# Patient Record
Sex: Female | Born: 1975 | Hispanic: Yes | Marital: Married | Smoking: Never smoker
Health system: Southern US, Community
[De-identification: ages and names within clinical notes are randomized; demographics above are authoritative.]

## PROBLEM LIST (undated history)

## (undated) DIAGNOSIS — I10 Essential (primary) hypertension: Secondary | ICD-10-CM

## (undated) DIAGNOSIS — J45909 Unspecified asthma, uncomplicated: Secondary | ICD-10-CM

## (undated) DIAGNOSIS — E119 Type 2 diabetes mellitus without complications: Secondary | ICD-10-CM

## (undated) DIAGNOSIS — K219 Gastro-esophageal reflux disease without esophagitis: Secondary | ICD-10-CM

## (undated) DIAGNOSIS — F32A Depression, unspecified: Secondary | ICD-10-CM

## (undated) DIAGNOSIS — N63 Unspecified lump in unspecified breast: Secondary | ICD-10-CM

## (undated) DIAGNOSIS — R519 Headache, unspecified: Secondary | ICD-10-CM

## (undated) DIAGNOSIS — F329 Major depressive disorder, single episode, unspecified: Secondary | ICD-10-CM

## (undated) DIAGNOSIS — T8859XA Other complications of anesthesia, initial encounter: Secondary | ICD-10-CM

## (undated) HISTORY — PX: BREAST EXCISIONAL BIOPSY: SUR124

## (undated) HISTORY — DX: Unspecified lump in unspecified breast: N63.0

## (undated) HISTORY — DX: Major depressive disorder, single episode, unspecified: F32.9

## (undated) HISTORY — DX: Unspecified asthma, uncomplicated: J45.909

## (undated) HISTORY — DX: Depression, unspecified: F32.A

---

## 1997-04-07 HISTORY — PX: BREAST CYST EXCISION: SHX579

## 1998-04-07 HISTORY — PX: BREAST SURGERY: SHX581

## 2007-02-13 ENCOUNTER — Emergency Department: Payer: Self-pay | Admitting: Emergency Medicine

## 2007-02-13 IMAGING — US US OB < 14 WEEKS - US OB TV
1 series · 17 of 28 positions shown · non-contrast
Comparison: none

REASON FOR EXAM: Pelvic pain and bleeding
COMMENTS:

[Series 1: us ob < 14 weeks - us ob tv · 17 of 50 slices shown]
[im 1/50]
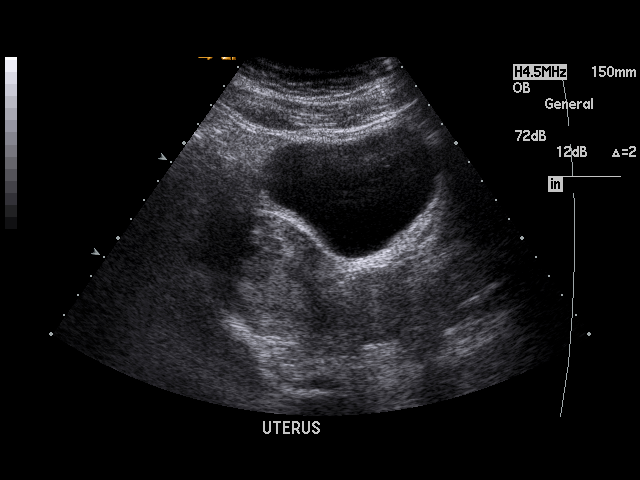
[im 4/50]
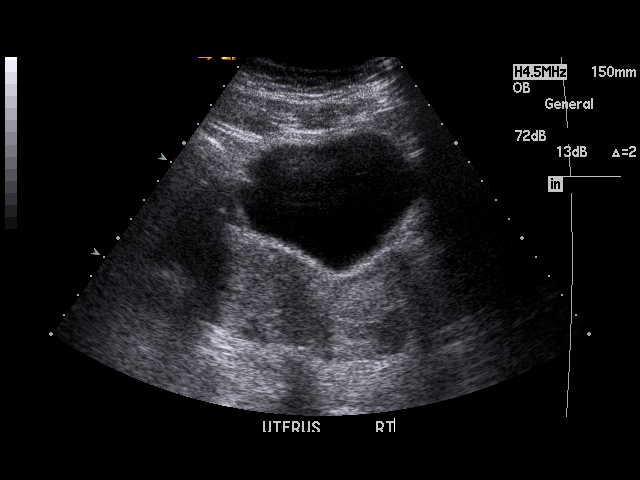
[im 8/50]
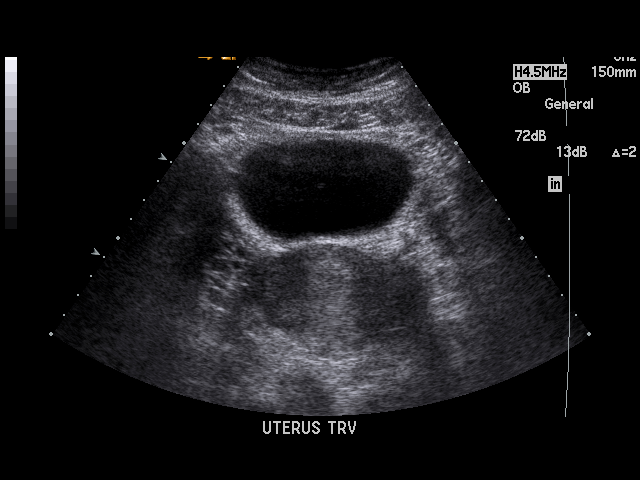
[im 10/50]
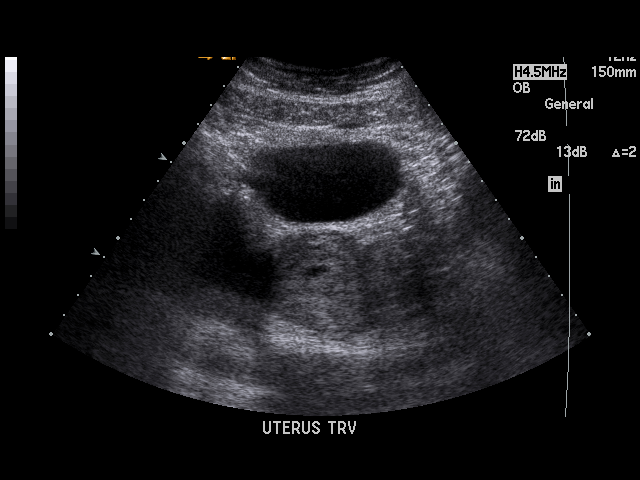
[im 13/50]
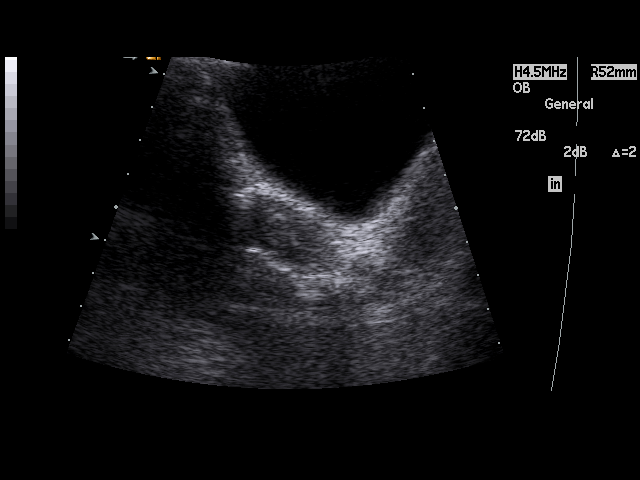
[im 17/50]
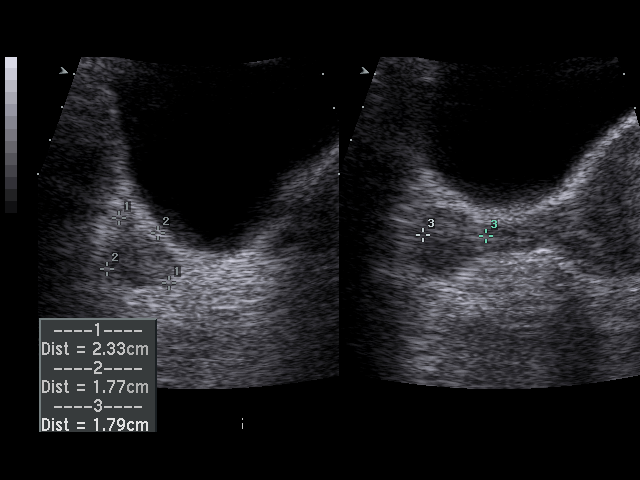
[im 19/50]
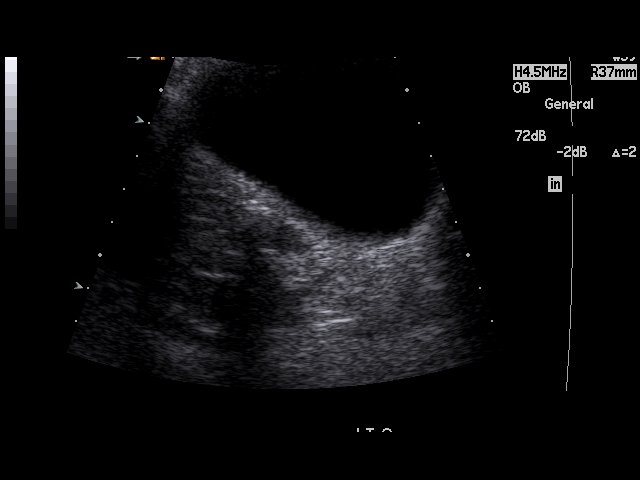
[im 22/50]
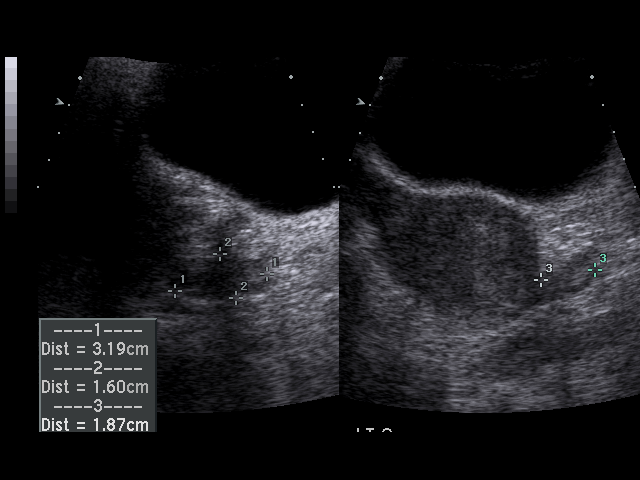
[im 26/50]
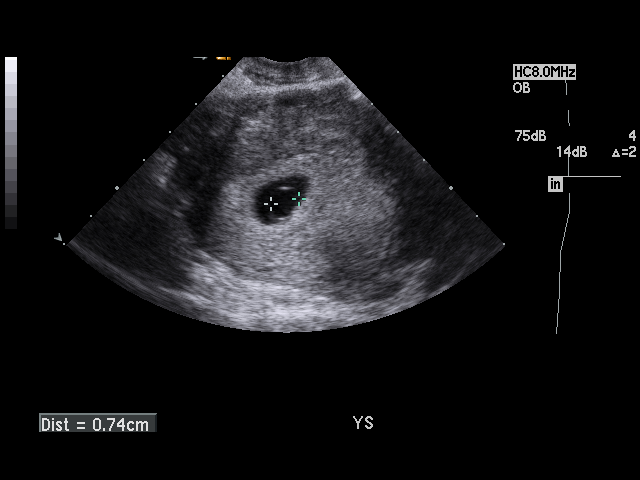
[im 28/50]
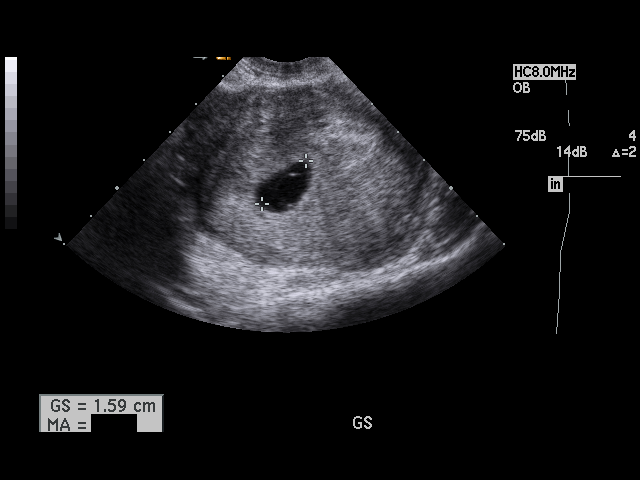
[im 31/50]
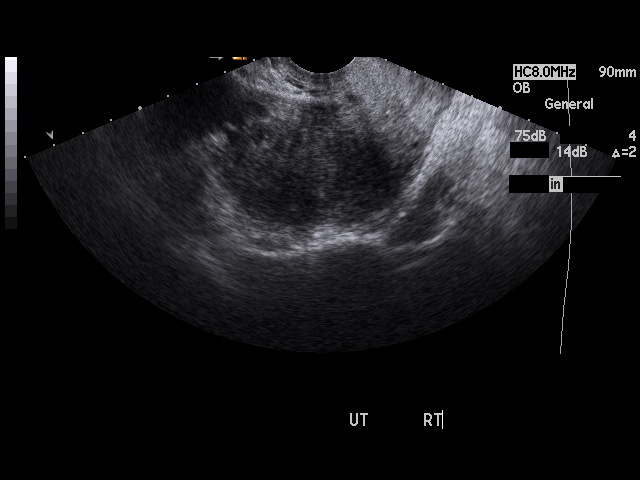
[im 33/50]
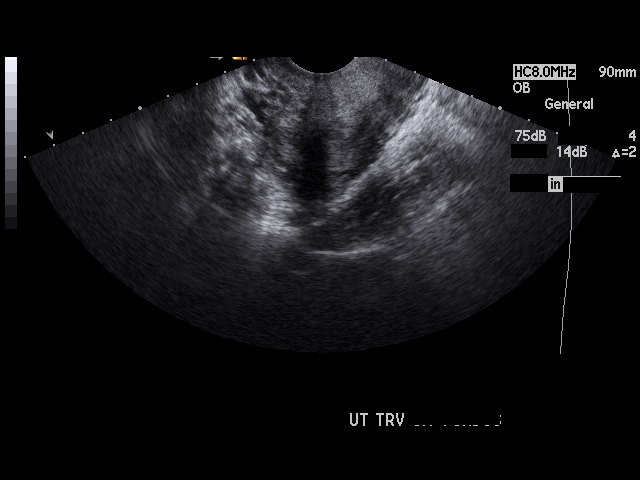
[im 37/50]
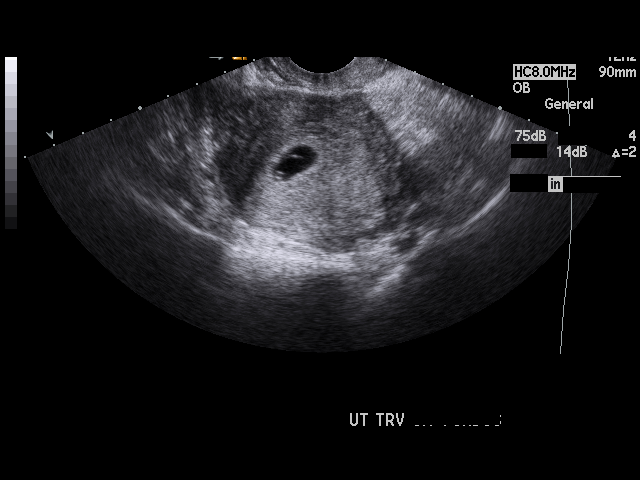
[im 40/50]
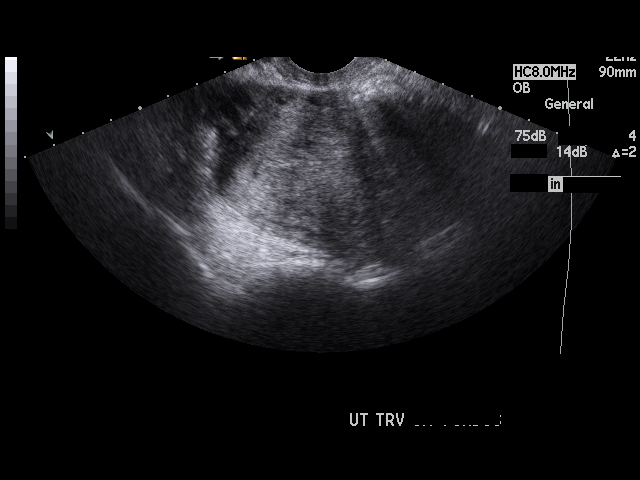
[im 42/50]
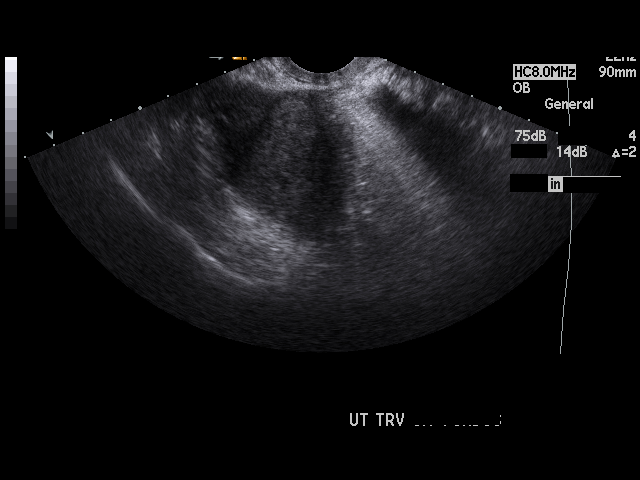
[im 46/50]
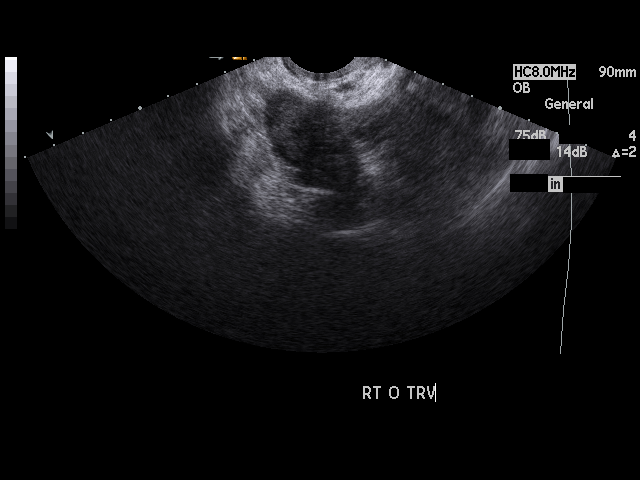
[im 50/50]
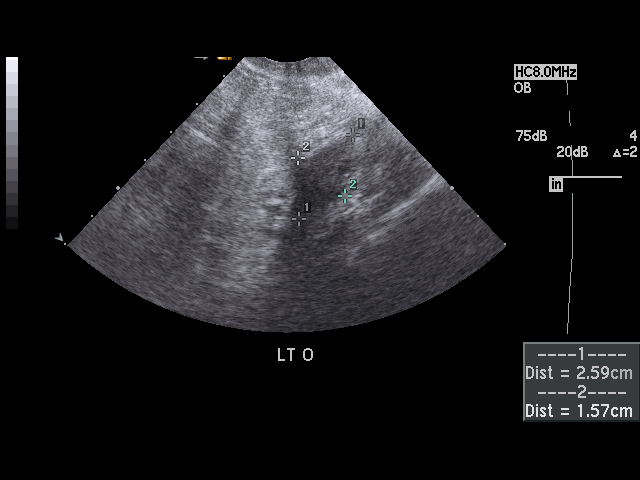

[17 of 28 positions shown; findings below may reference images not displayed]

PROCEDURE:     US  - US OB LESS THAN 14 WEEKS  - [DATE]  [DATE]

RESULT:  The patient is exhibiting vaginal bleeding and experiencing pelvic
discomfort.

There is a fluid filled sac in the endometrial cavity measuring 1.18 cm
which would correspond to a 5 week, 2 day gestation. No fetal pole is seen.
There is a structure that likely reflects a yolk sac. No fetal cardiac
activity is identified. The maternal RIGHT ovary measures 2.3 x 1.8 x
cm. The LEFT ovary measures 3.1 x 1.6 x 1.9 cm. There is no free fluid in
the cul-de-sac.
IMPRESSION: 1.  There is a fluid filled sac present which may contain a yolk sac but no
fetal pole is identified. This may reflect a blighted ovum or an incomplete
miscarriage. Follow-up scanning will be needed.
2.  The maternal adnexal structures are normal in appearance.

A preliminary report was sent to the [HOSPITAL] the conclusion
of the study.

## 2008-04-07 DIAGNOSIS — J45909 Unspecified asthma, uncomplicated: Secondary | ICD-10-CM

## 2008-04-07 DIAGNOSIS — N63 Unspecified lump in unspecified breast: Secondary | ICD-10-CM

## 2008-04-07 HISTORY — DX: Unspecified lump in unspecified breast: N63.0

## 2008-04-07 HISTORY — DX: Unspecified asthma, uncomplicated: J45.909

## 2008-04-14 ENCOUNTER — Ambulatory Visit: Payer: Self-pay | Admitting: Family Medicine

## 2008-04-14 IMAGING — CR DG LUMBAR SPINE AP/LAT/OBLIQUES W/ FLEX AND EXT
1 series · 5 of 5 positions shown · non-contrast
Comparison: none

REASON FOR EXAM: Back pain
COMMENTS:

[Series 1: view not recorded · 0.17mm/px · 5 of 5 slices shown]
[im 1/5]
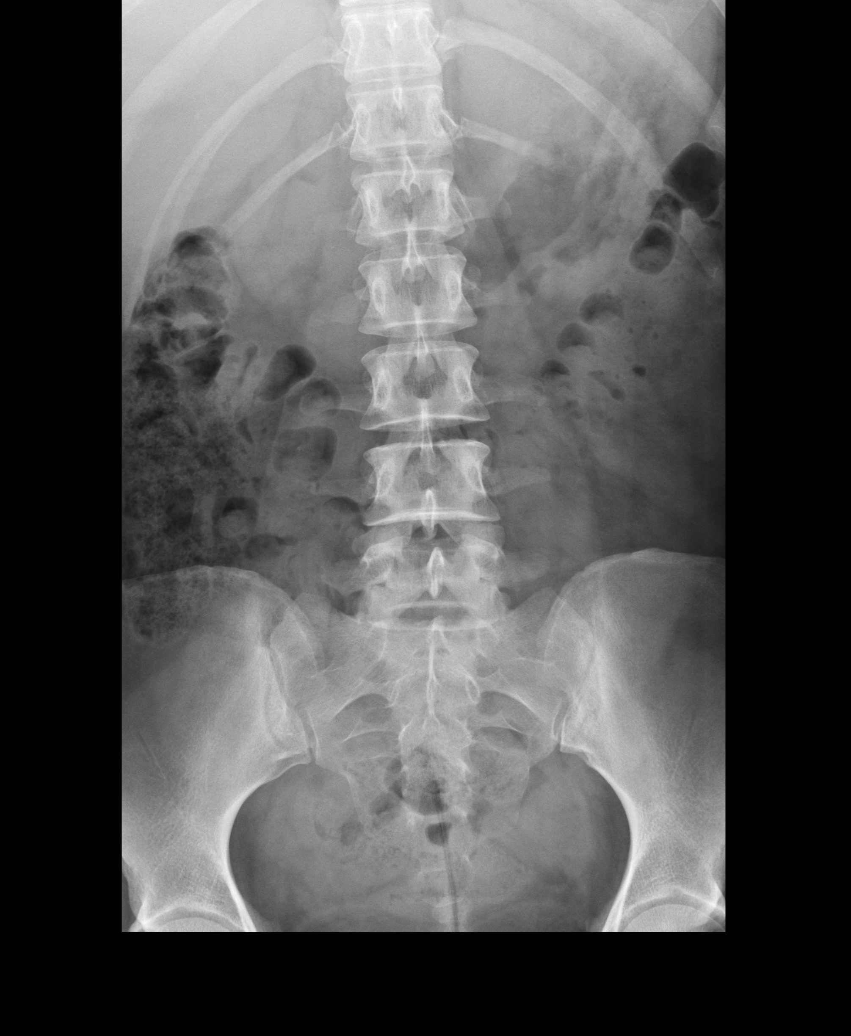
[im 2/5]
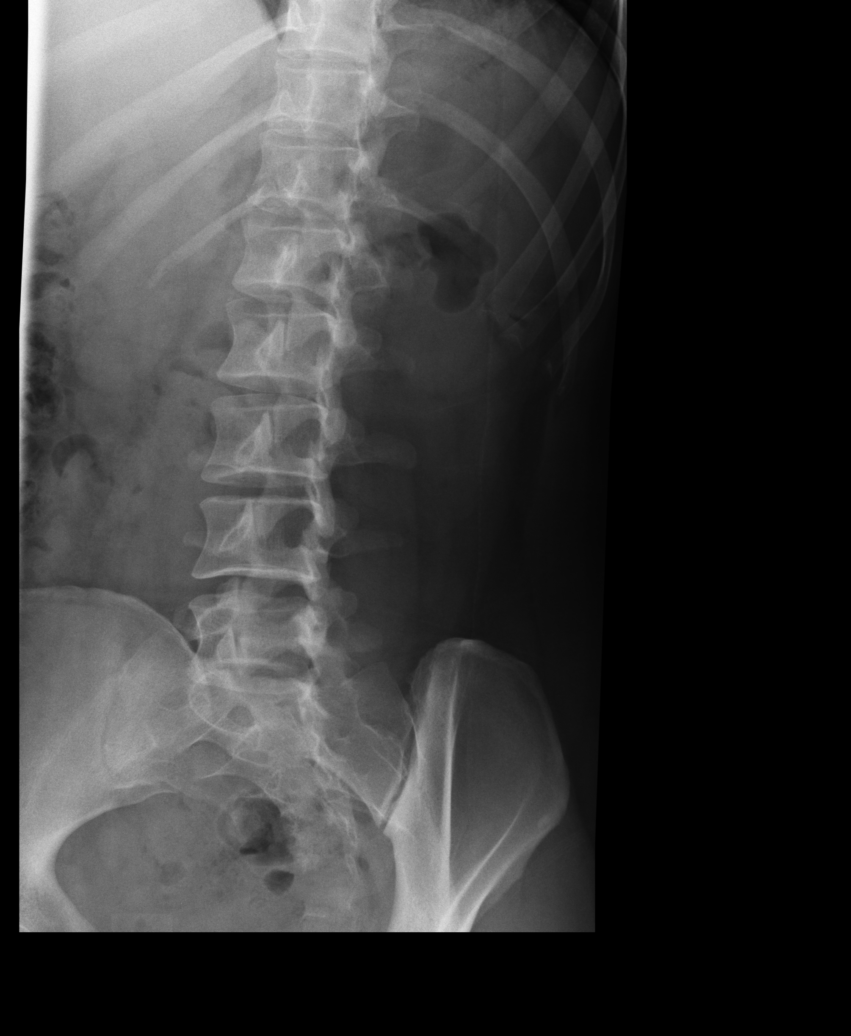
[im 3/5]
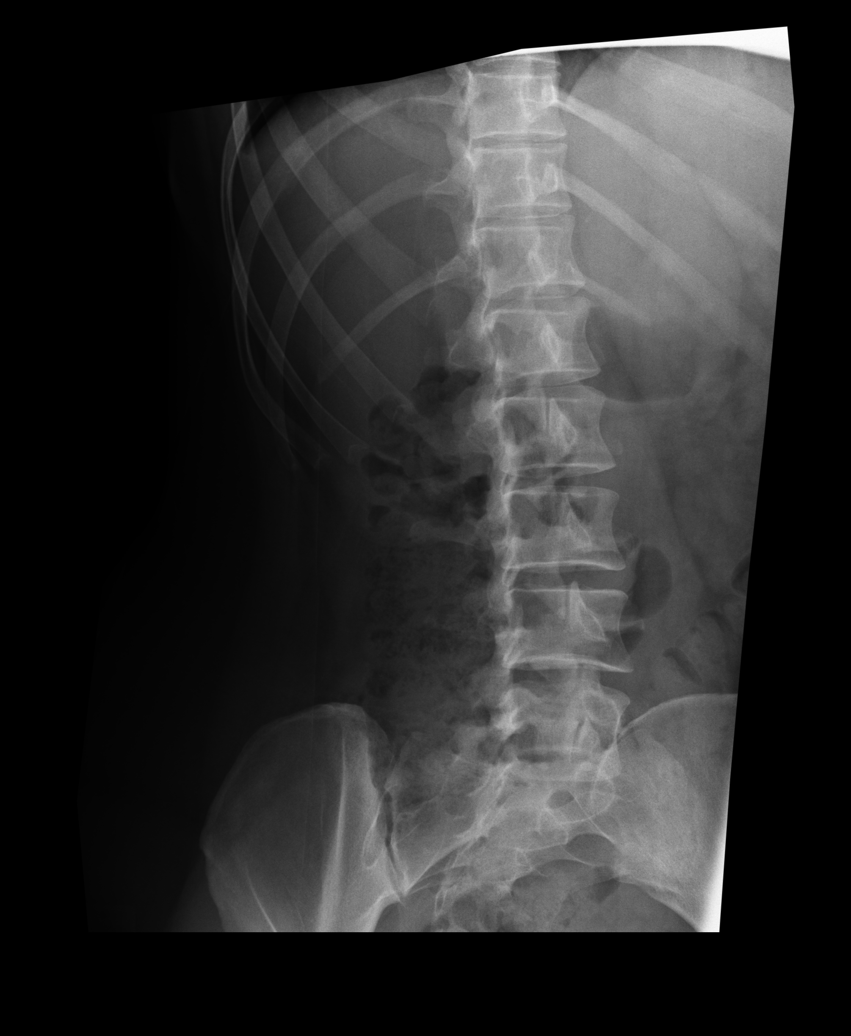
[im 4/5]
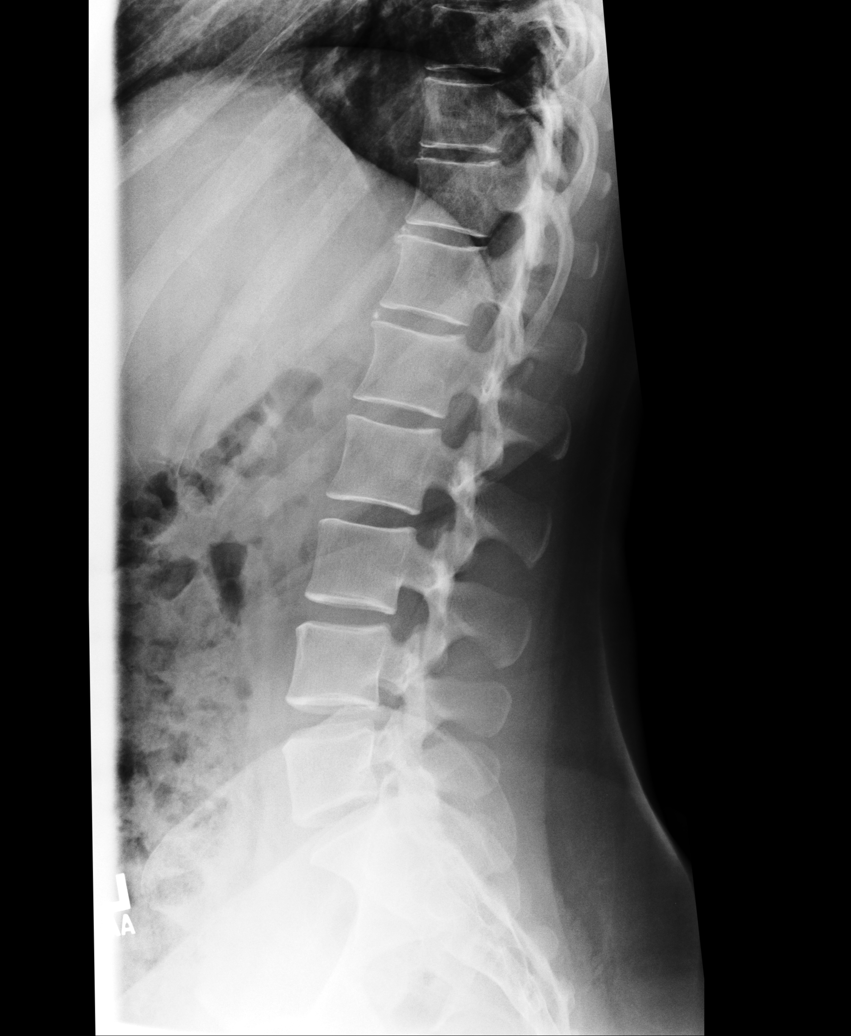
[im 5/5]
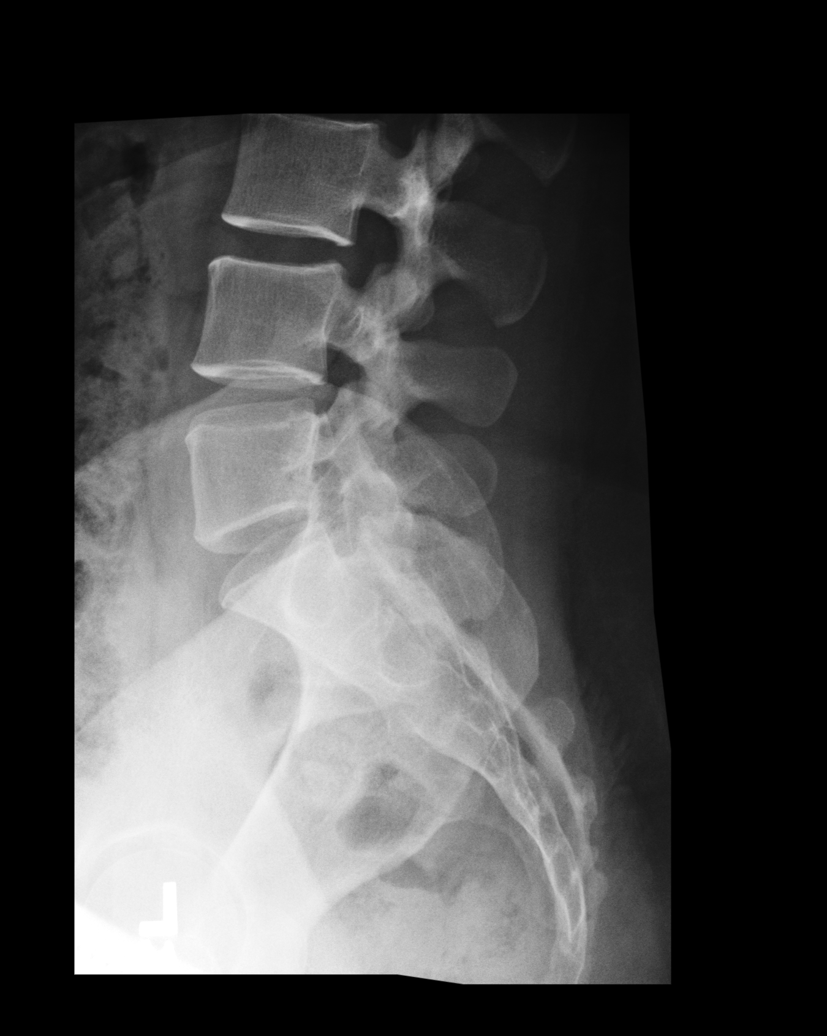

[5 of 5 positions shown; findings below may reference images not displayed]

PROCEDURE:     DXR - DXR LUMBAR SPINE WITH OBLIQUES  - [DATE]  [DATE]

RESULT:     The lumbar vertebral bodies are preserved in height. The
intervertebral disc space heights are reasonably well maintained. The
posterior elements appear intact. I see no pars defects or
spondylolisthesis. The pedicles and transverse processes are intact.
IMPRESSION: I do not see acute bony abnormality of the lumbar spine or
significant degenerative change. Given that there are chronic radicular
symptoms, further evaluation with MRI may be useful.

## 2008-04-20 ENCOUNTER — Ambulatory Visit: Payer: Self-pay | Admitting: Family Medicine

## 2008-04-20 IMAGING — US US PELV - US TRANSVAGINAL
1 series · 17 of 25 positions shown · non-contrast
Comparison: none

REASON FOR EXAM: pelvic pain hx ovarian cysts  PR notified for interpreter
COMMENTS:

[Series 1: us pelv - us transvaginal · 17 of 74 slices shown]
[im 1/74]
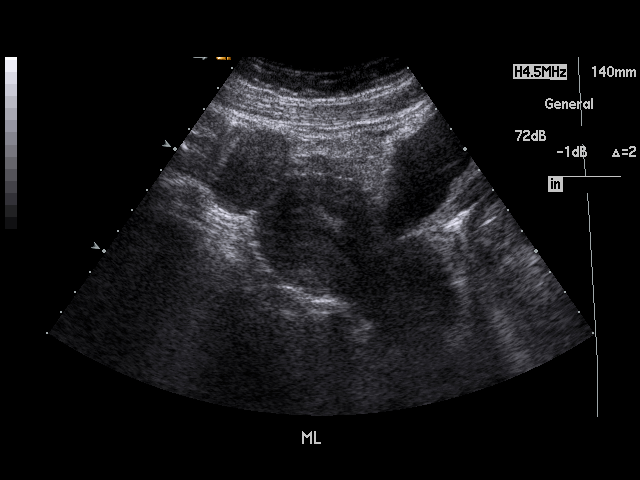
[im 7/74]
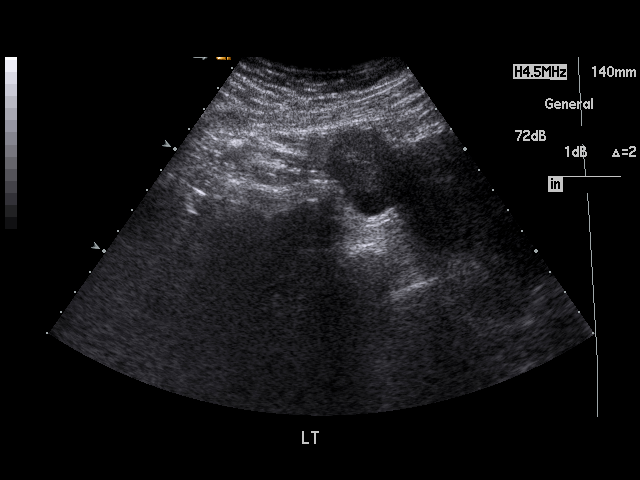
[im 10/74]
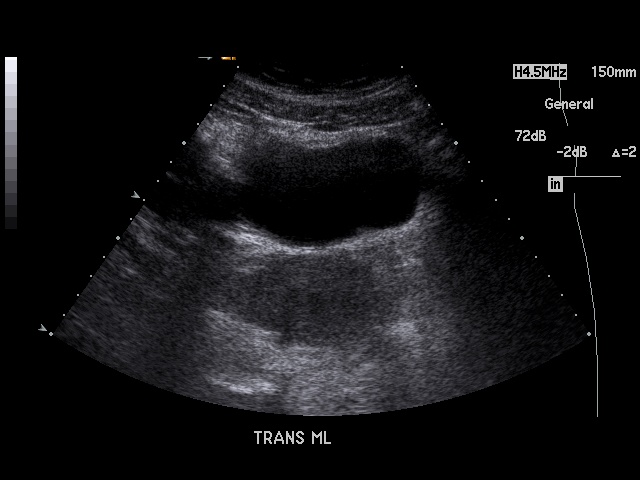
[im 16/74]
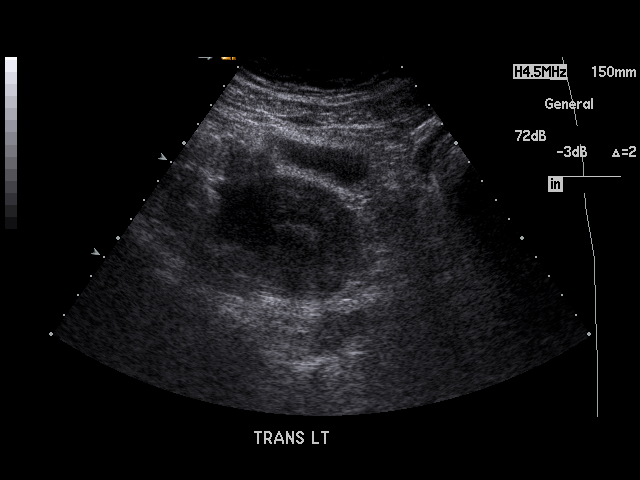
[im 19/74]
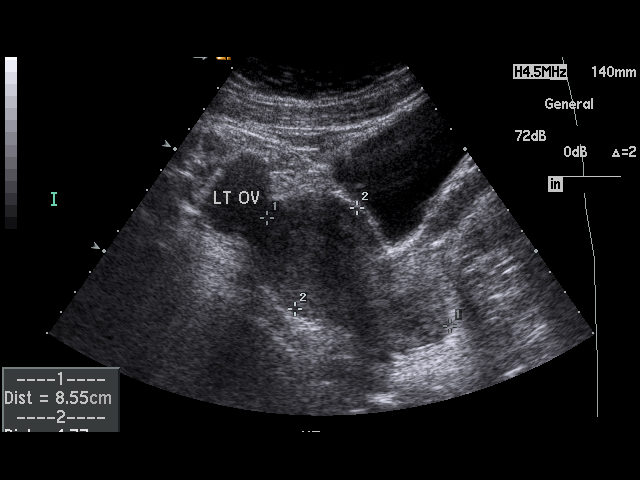
[im 25/74]
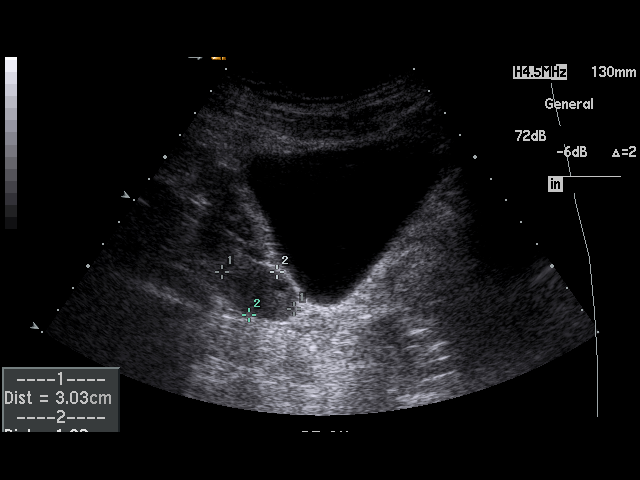
[im 28/74]
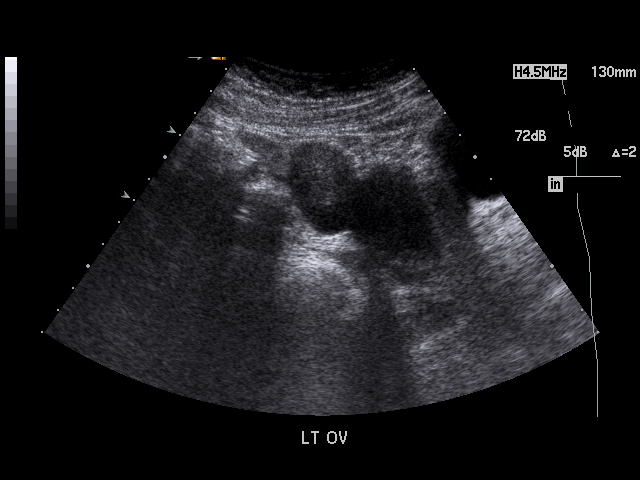
[im 34/74]
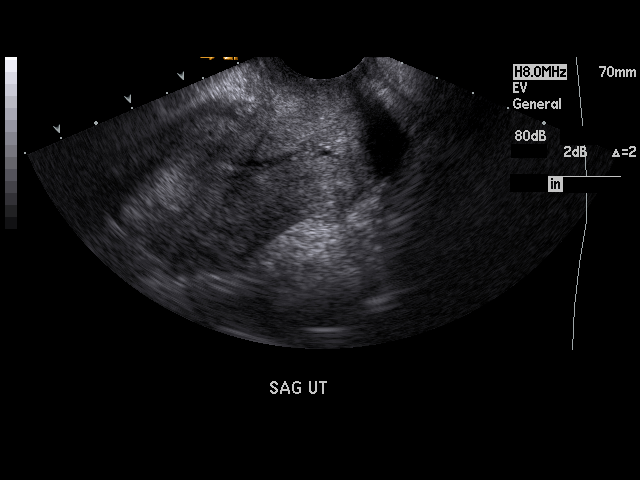
[im 37/74]
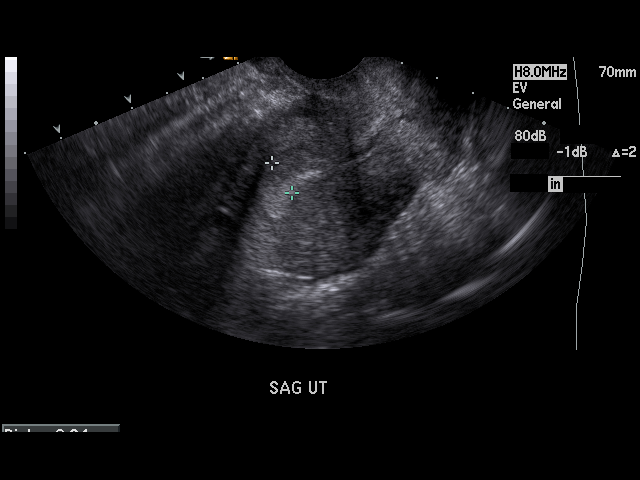
[im 40/74]
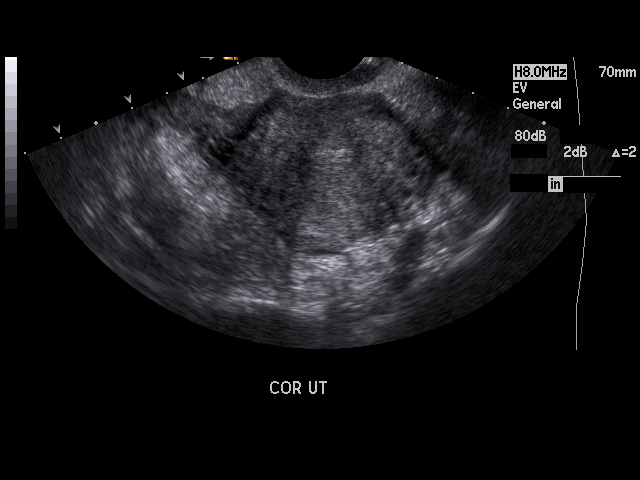
[im 46/74]
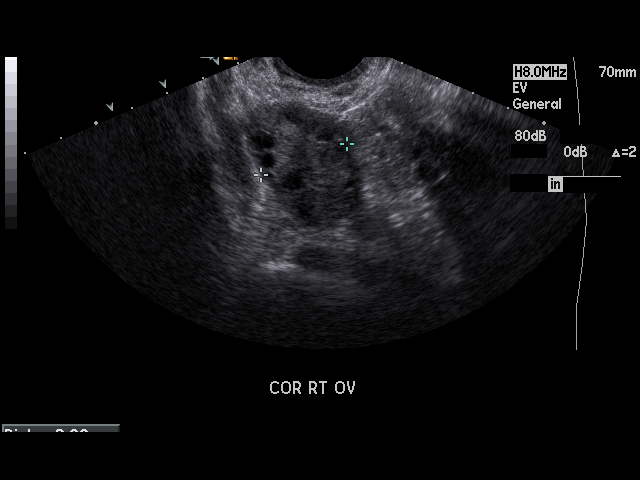
[im 49/74]
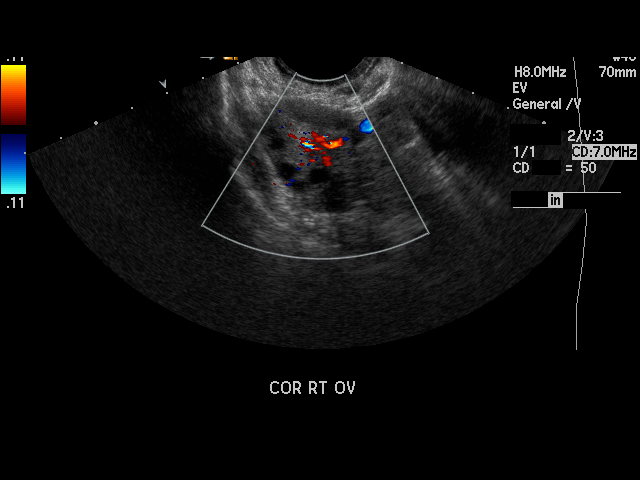
[im 55/74]
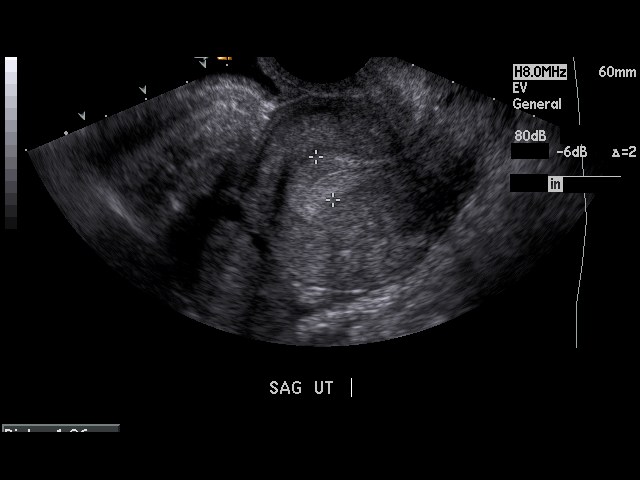
[im 58/74]
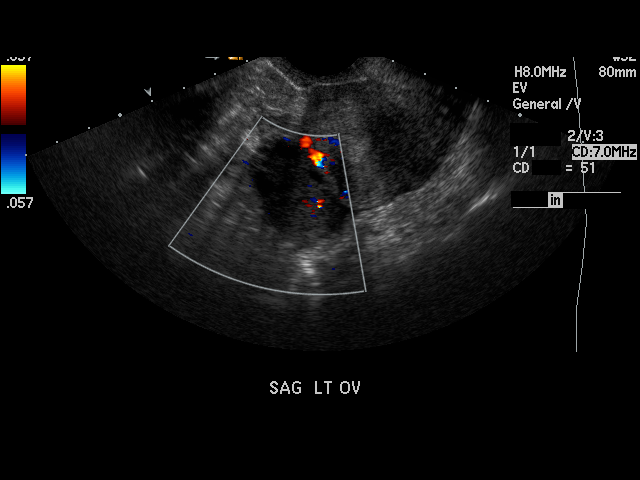
[im 64/74]
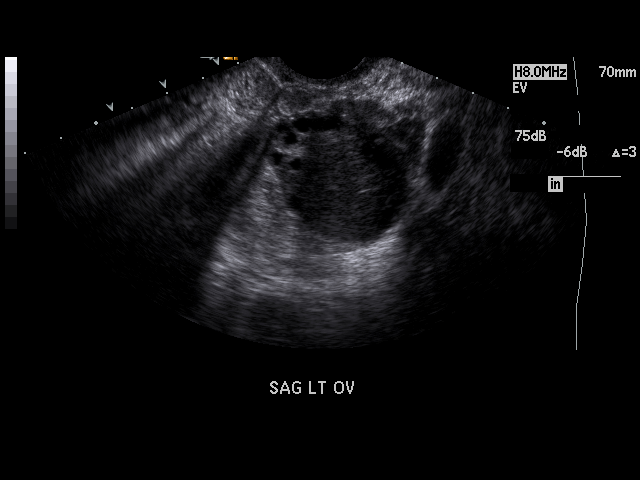
[im 67/74]
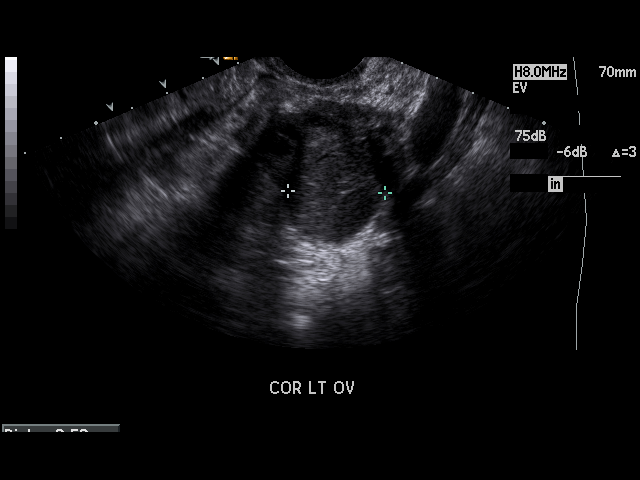
[im 74/74]
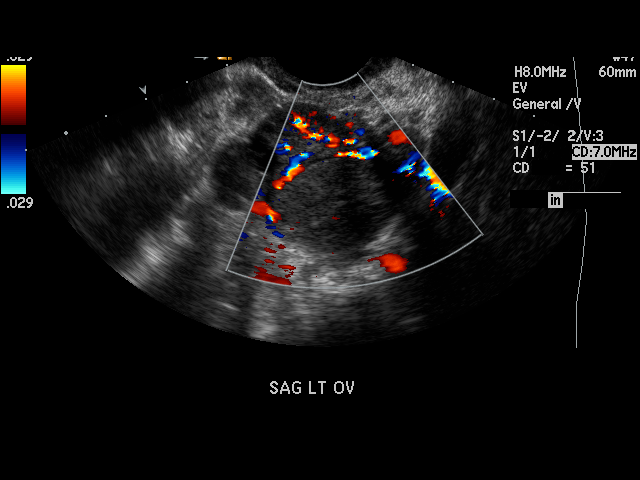

[17 of 25 positions shown; findings below may reference images not displayed]

PROCEDURE:     US  - US PELVIS MASS EXAM W/TRANSVAGI  - [DATE]  [DATE]

RESULT:     Transabdominal and endovaginal ultrasound was performed. The
uterus measures 8.55 cm x 5.88 cm x 4.77 cm.  No uterine mass lesions are
seen. The endometrium measures 10.6 mm in thickness. The RIGHT and LEFT
ovaries are visualized. The RIGHT ovary measures 3.56 cm at maximum diameter
and the LEFT ovary measures 3.76 cm at maximum diameter. A few follicular
cysts are noted in each ovary. No abnormal adnexal masses are seen. There is
no free fluid in the pelvis. The kidneys are visualized bilaterally and show
no hydronephrosis. The visualized portion of the urinary bladder is normal
in appearance.
IMPRESSION: 1.     No significant abnormalities are identified.

## 2009-01-04 ENCOUNTER — Ambulatory Visit: Payer: Self-pay | Admitting: Certified Nurse Midwife

## 2009-01-04 IMAGING — US US OB US >=[ID] SNGL FETUS
1 series · 17 of 28 positions shown · non-contrast
Comparison: none

REASON FOR EXAM: COMMENTS:

[Series 1: us ob us >=(id) sngl fetus · 17 of 61 slices shown]
[im 1/61]
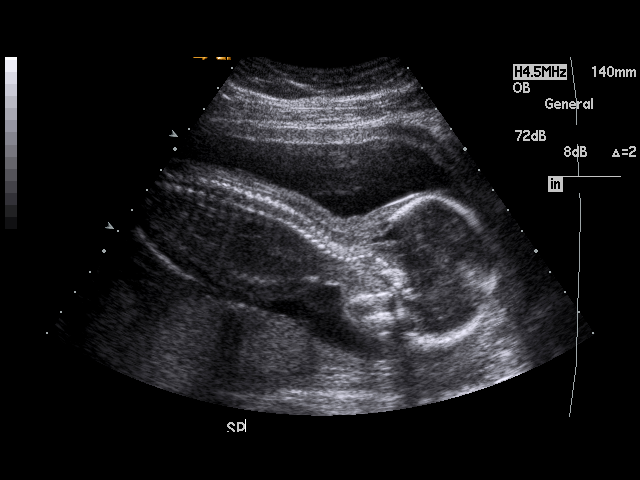
[im 5/61]
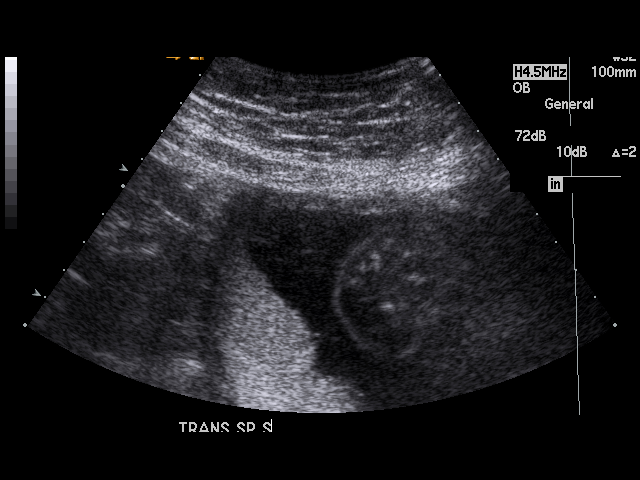
[im 9/61]
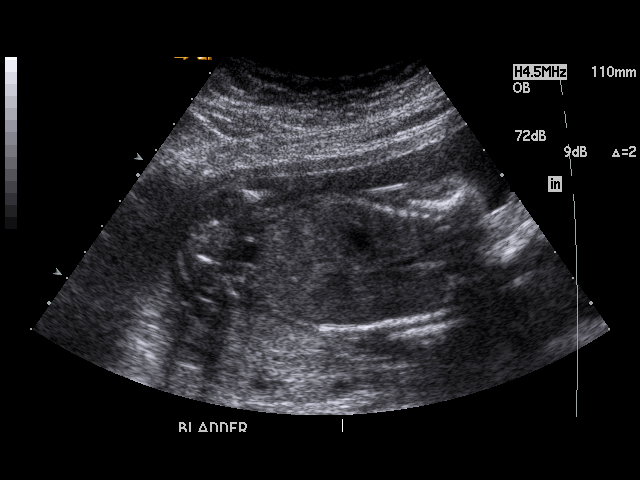
[im 12/61]
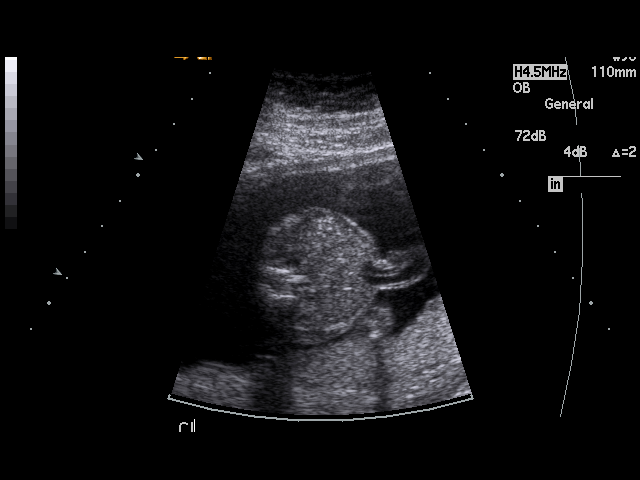
[im 16/61]
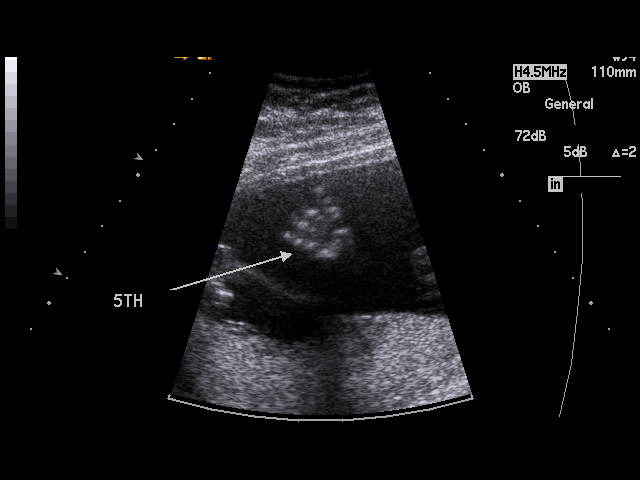
[im 21/61]
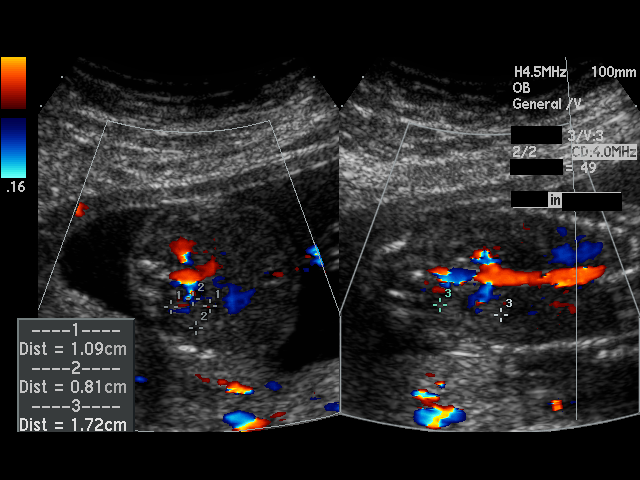
[im 23/61]
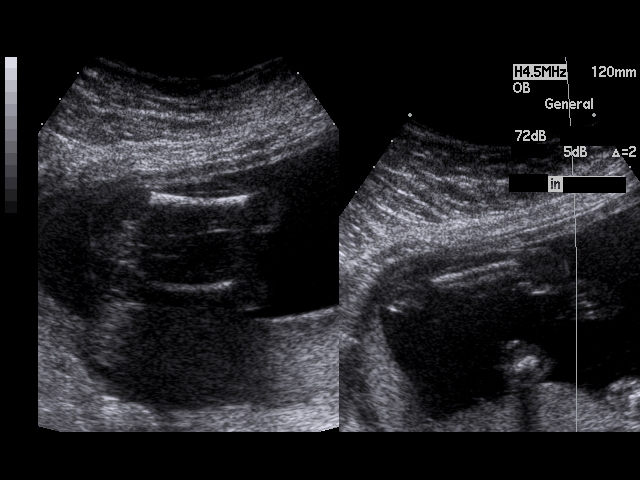
[im 27/61]
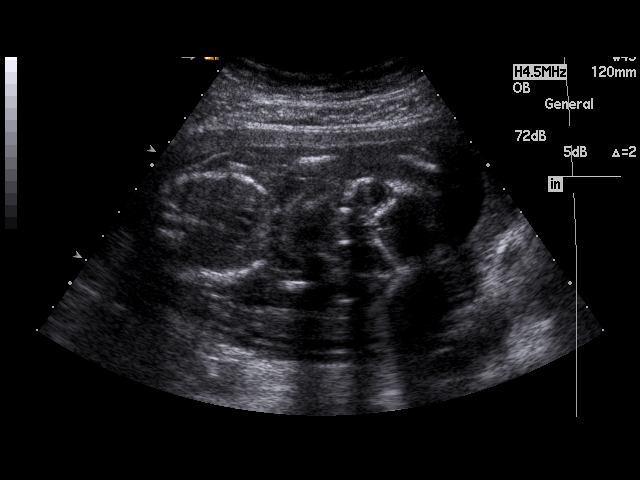
[im 32/61]
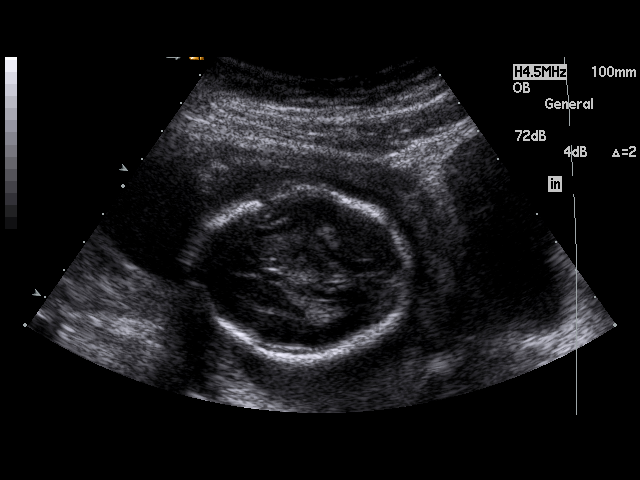
[im 34/61]
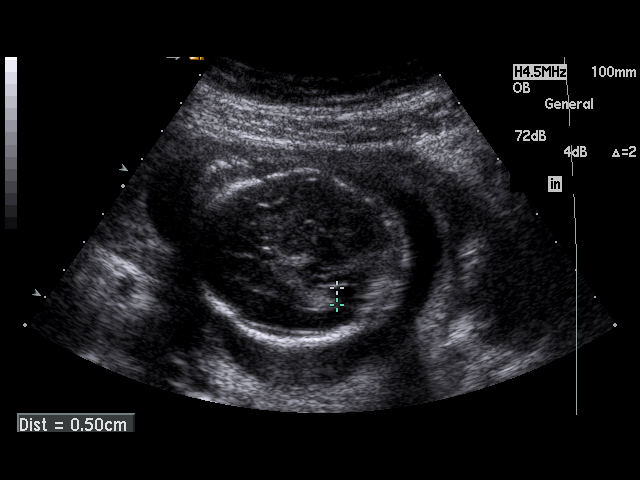
[im 38/61]
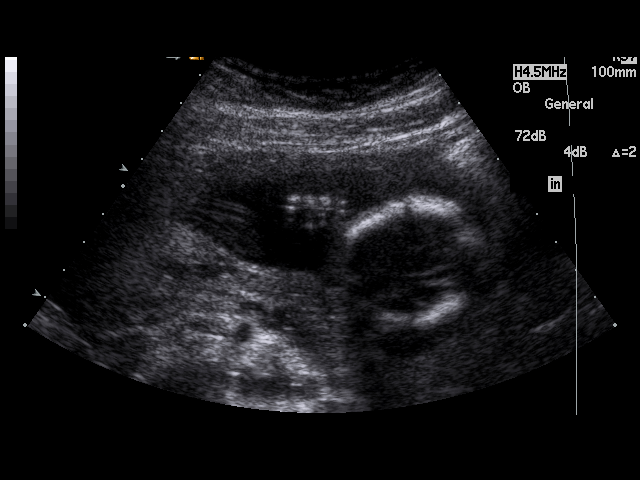
[im 41/61]
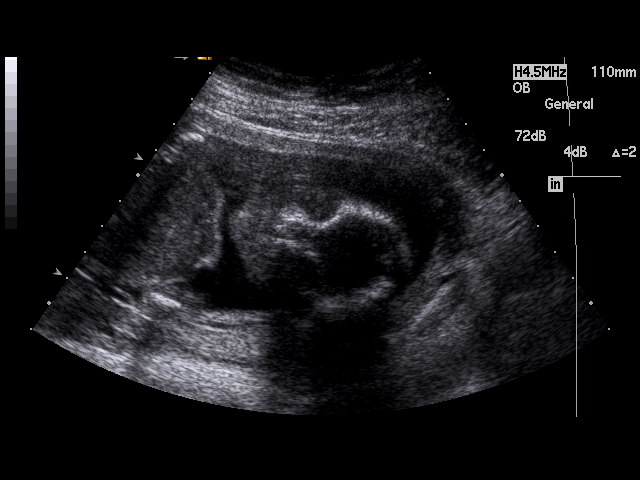
[im 45/61]
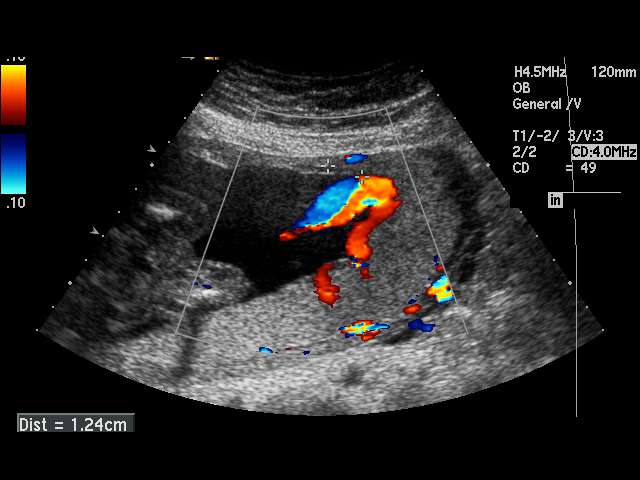
[im 49/61]
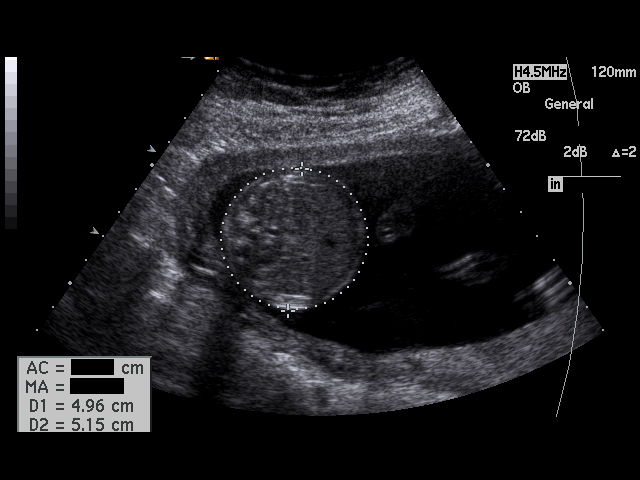
[im 52/61]
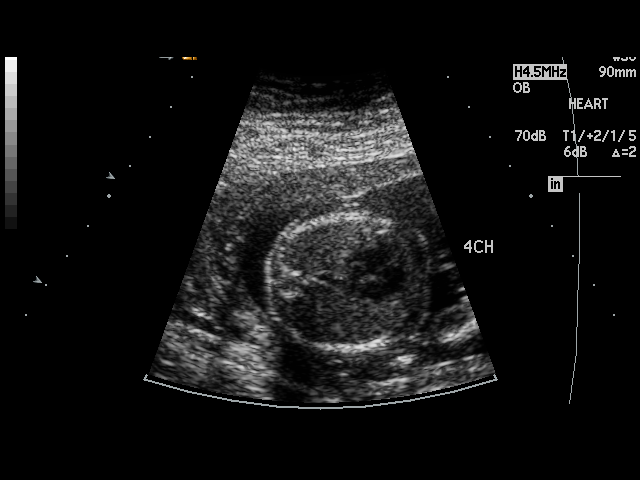
[im 56/61]
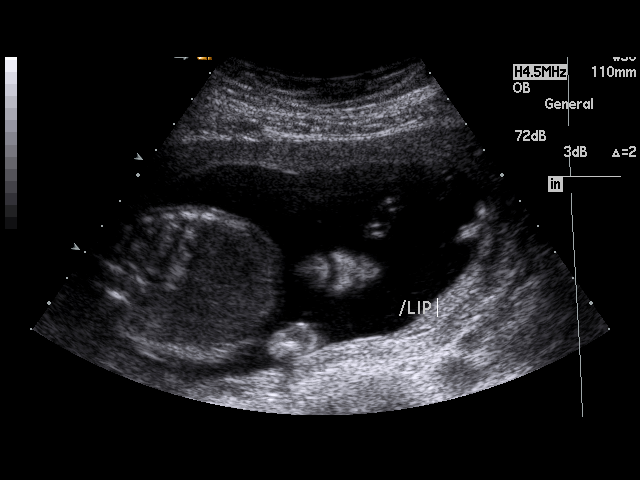
[im 61/61]
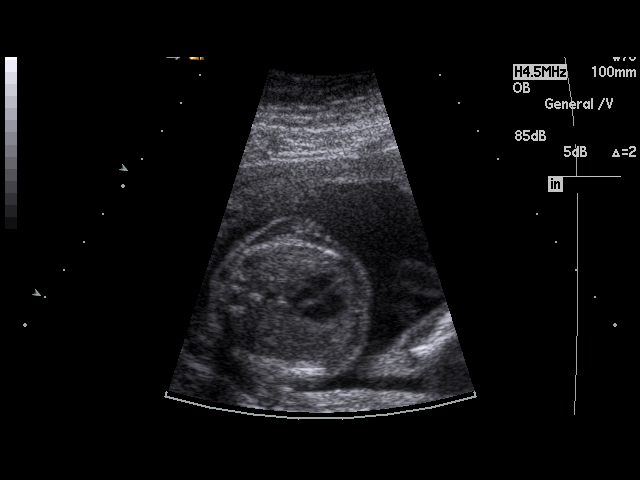

[17 of 28 positions shown; findings below may reference images not displayed]

PROCEDURE:     US  - US OB GREATER/OR EQUAL TO [1F]  - [DATE]  [DATE]

RESULT:      A single, viable intrauterine pregnancy is appreciated with
estimated fetal heart rate of 150 beats per minute. Visual evaluation of the
amniotic fluid is unremarkable. Fetal anatomy demonstrates no bladder,
renal, stomach, cardiac, diaphragm, spine or ventricular abnormalities. The
placenta is Grade 0 and is in a posterior left lateral fundal location.

Fetal biometry:

BPD is 4.96 cm; EGA is 21 weeks, 0 days

HC is 17.86 cm; EGA is 20 weeks, 2 days

AC is 15.55 cm; EGA is 20 weeks, 5 days

FL is 3.17 cm; EGA is 19 weeks, 6 days

HL is 3.02 cm; EGA is 20 weeks, 0 days

Estimated fetal weight is 375 grams + / - 50 grams which corresponds to 13
ounces + / - 2 ounces.

EGA per LMP is 18 weeks, 2 days with an EDD of [DATE]. EGA per ultrasound
is 20 weeks, 2 days and EDD of [DATE].
IMPRESSION: Single viable intrauterine as described above.

Addendum:  The patient's EDD per ultrasound is  [DATE] not [DATE].

## 2009-05-22 ENCOUNTER — Inpatient Hospital Stay: Payer: Self-pay

## 2010-07-31 ENCOUNTER — Ambulatory Visit: Payer: Self-pay | Admitting: Internal Medicine

## 2010-07-31 IMAGING — US ULTRASOUND LEFT BREAST
1 series · 13 of 13 positions shown · non-contrast
Comparison: none

REASON FOR EXAM: L  cystic tissue  2 to 4 oclock COPY TO HITESH
INTERPRETER NEEDED
COMMENTS:

PROCEDURE:     US  - US BREAST LEFT  - [DATE]  [DATE]
RESULT:

[Series 1: ultrasound left breast · 13 of 13 slices shown]
[im 1/13]
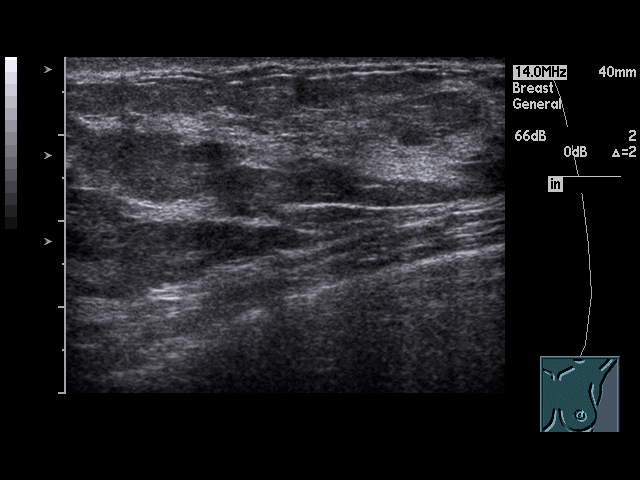
[im 2/13]
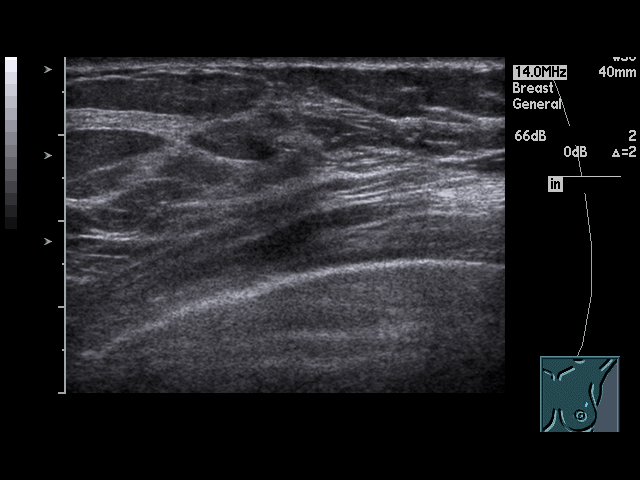
[im 3/13]
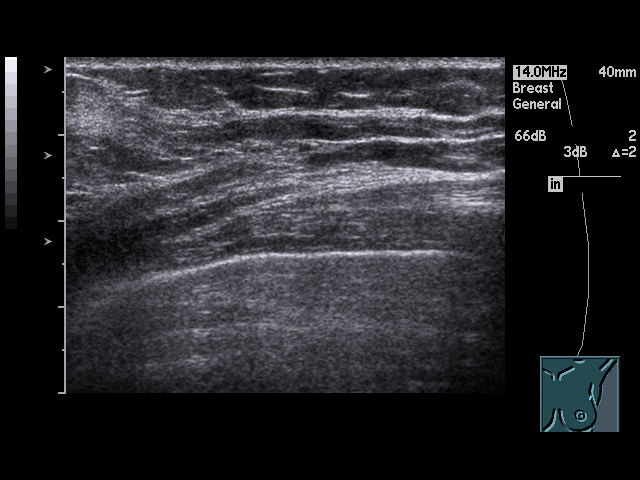
[im 4/13]
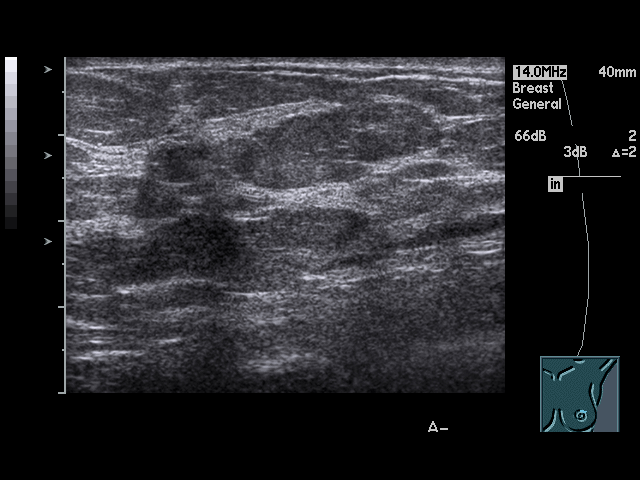
[im 5/13]
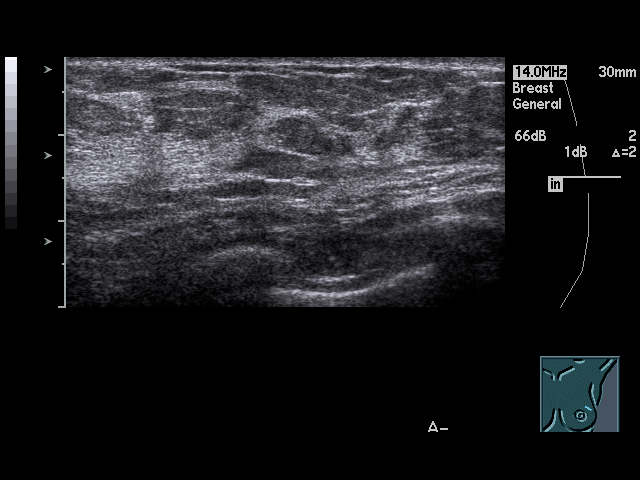
[im 6/13]
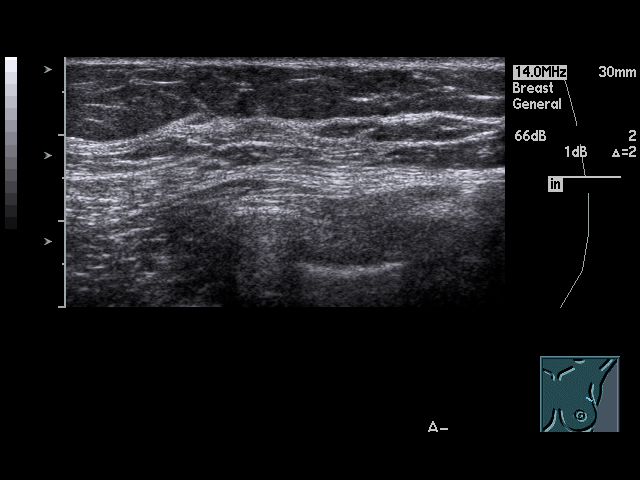
[im 7/13]
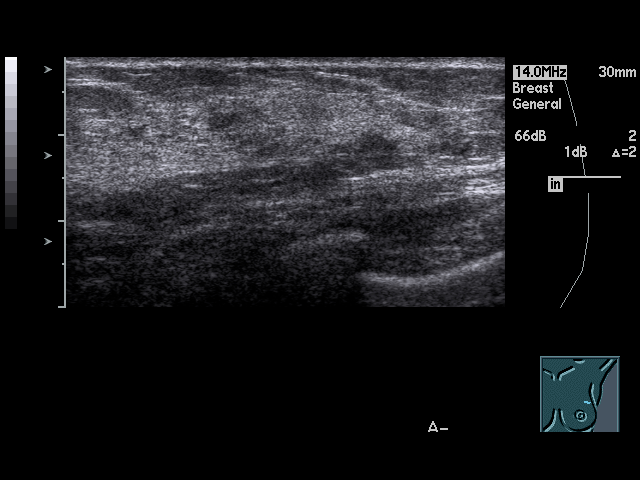
[im 8/13]
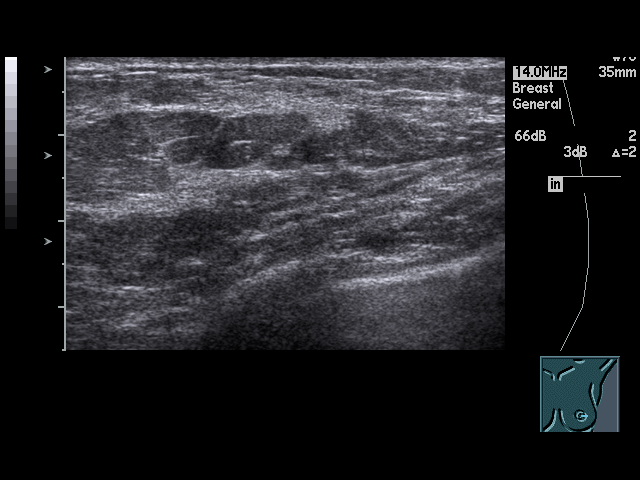
[im 9/13]
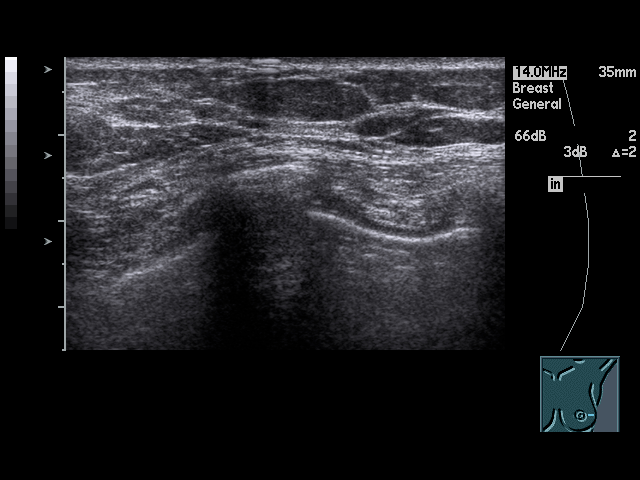
[im 10/13]
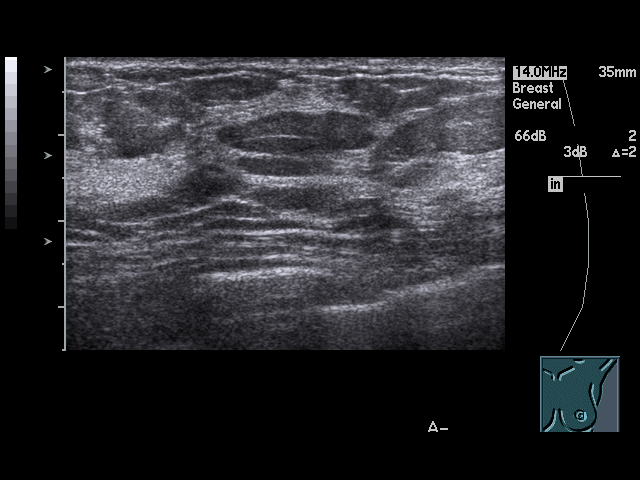
[im 11/13]
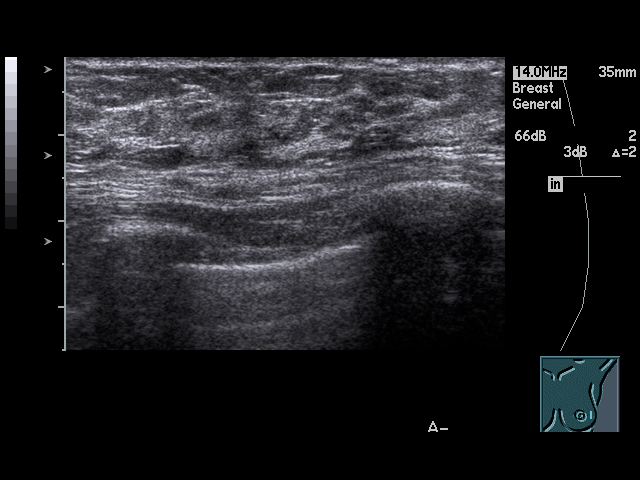
[im 12/13]
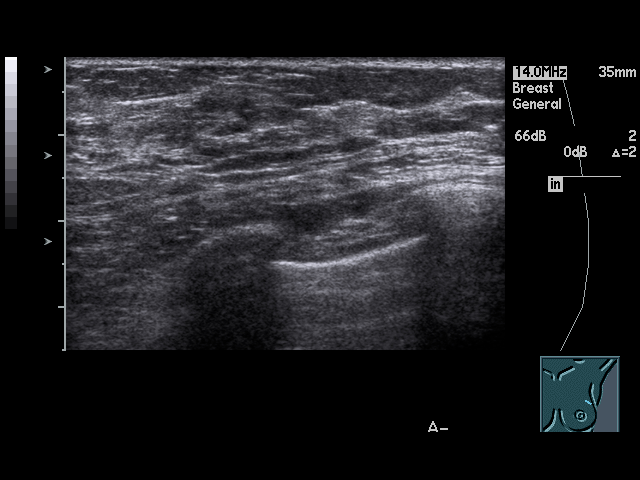
[im 13/13]
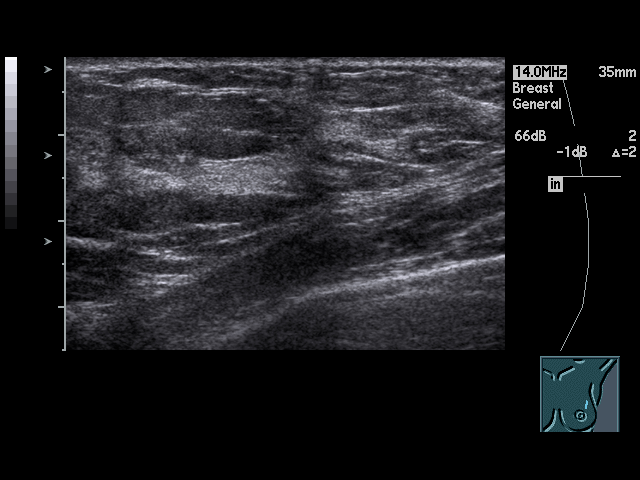

[13 of 13 positions shown; findings below may reference images not displayed]

FINDINGS: The left breast was evaluated in the region of interest from the 2
o'clock to the 3 o'clock position. No solid or cystic sonographic
abnormalities are appreciated.
IMPRESSION: Unremarkable focused left breast ultrasound as described
above. Please refer to the bilateral digital diagnostic mammogram for
completed discussion.

## 2010-07-31 IMAGING — MG MAM [PERSON_NAME] DIAGNOSTIC W/CAD
1 series · 8 of 8 positions shown · non-contrast
Comparison: none

REASON FOR EXAM: L  cystic tissue  2 to 4 oclock COPY TO TSEGESH
INTERPRETER NEEDED
COMMENTS:

[R CC · right · 8 of 9 slices shown]
[im 1/9]
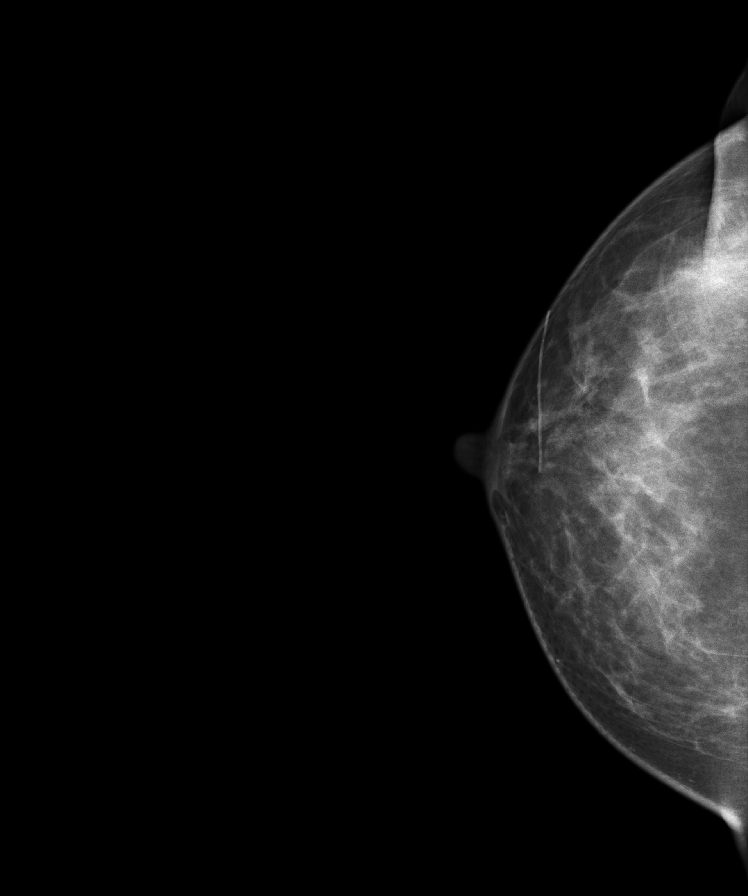
[im 2/9]
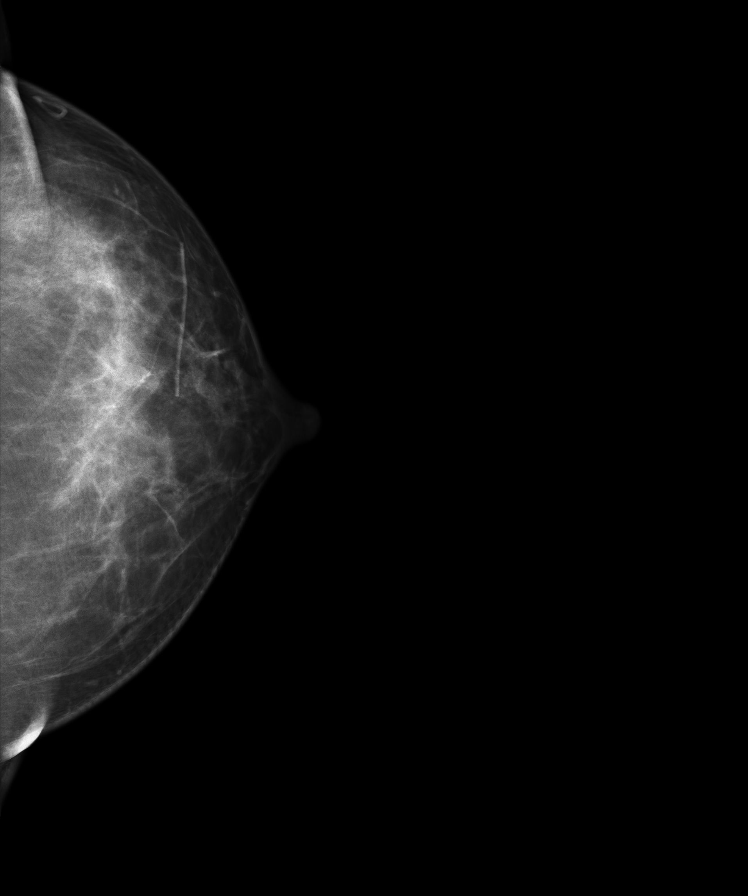
[im 3/9]
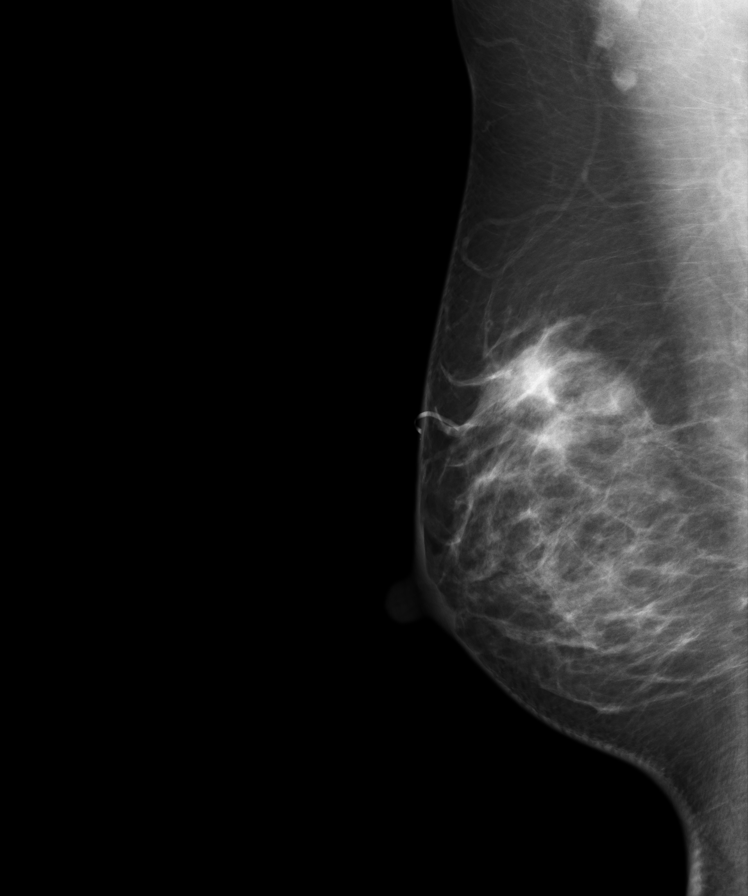
[im 4/9]
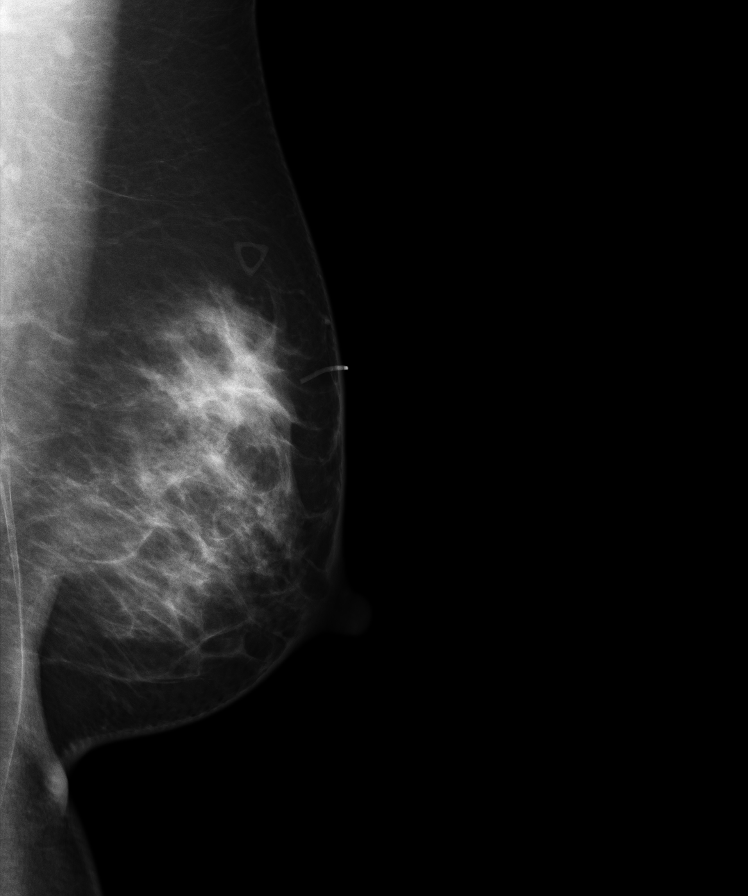
[im 5/9]
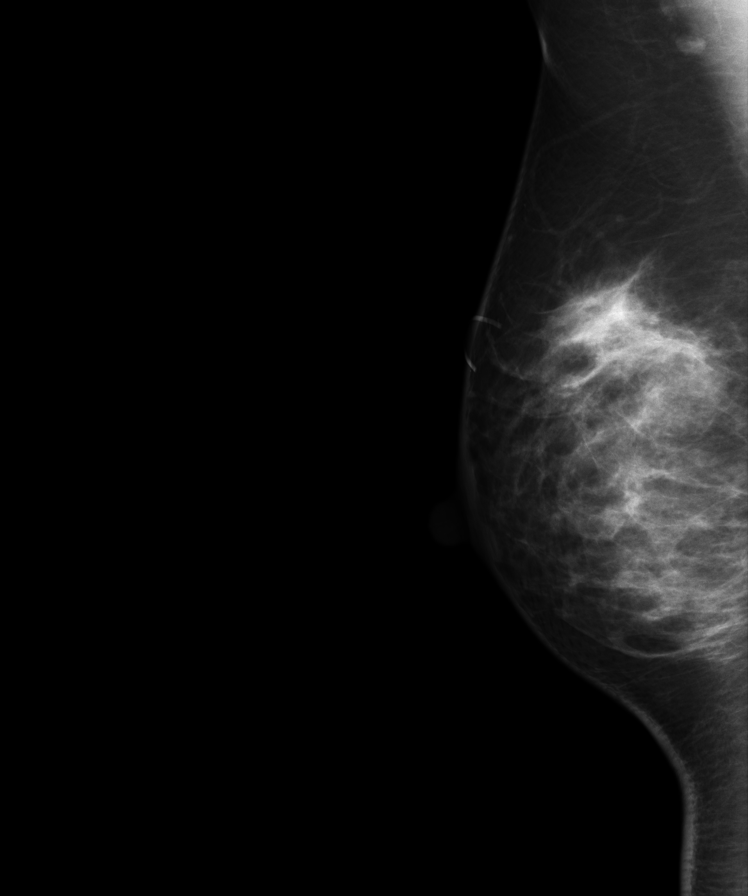
[im 6/9]
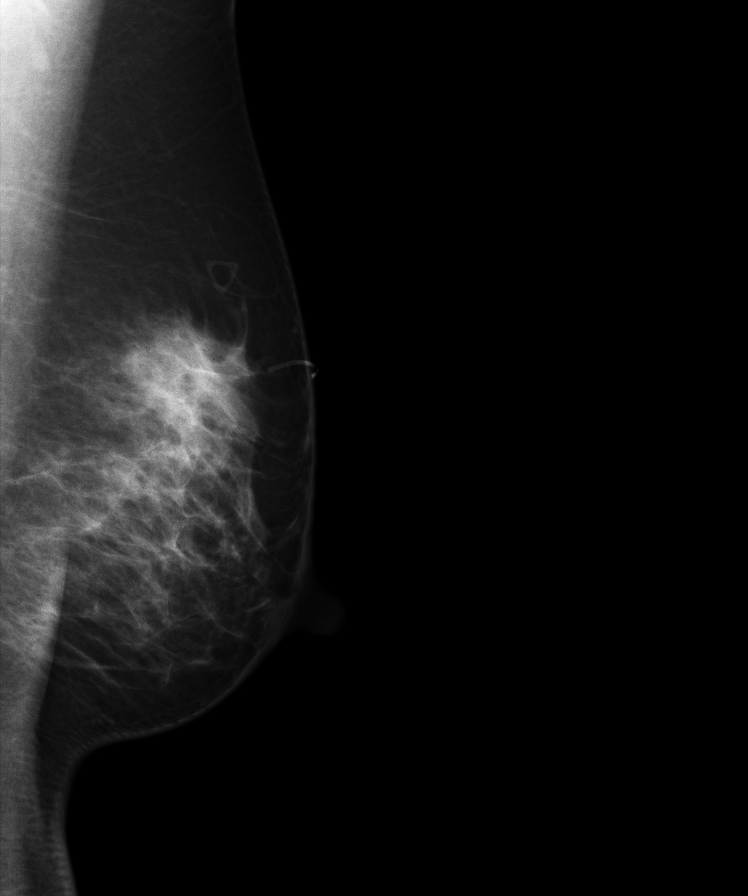
[im 7/9]
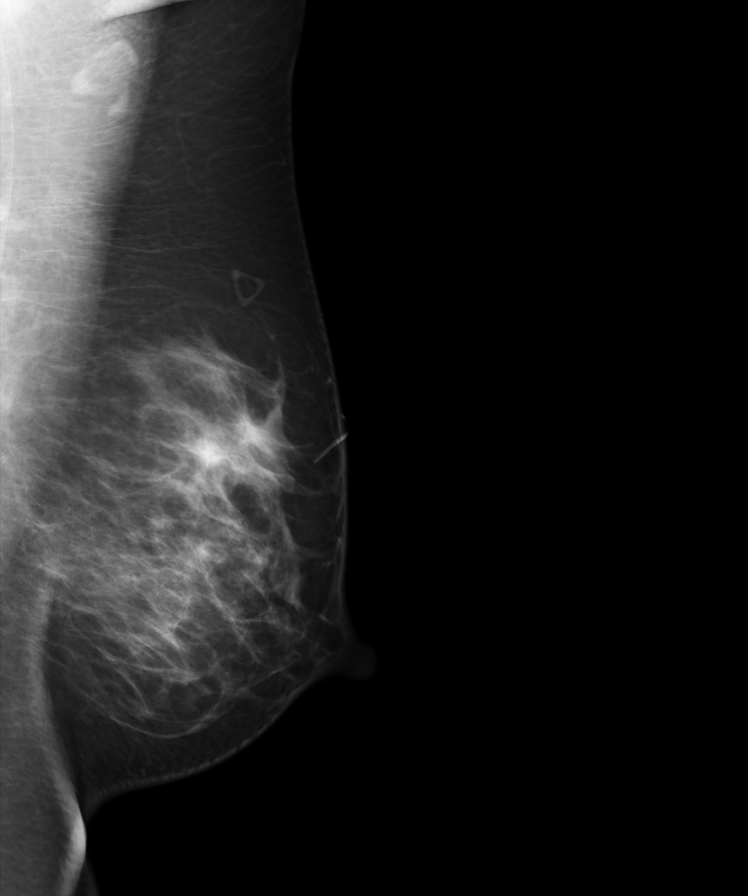
[im 9/9]
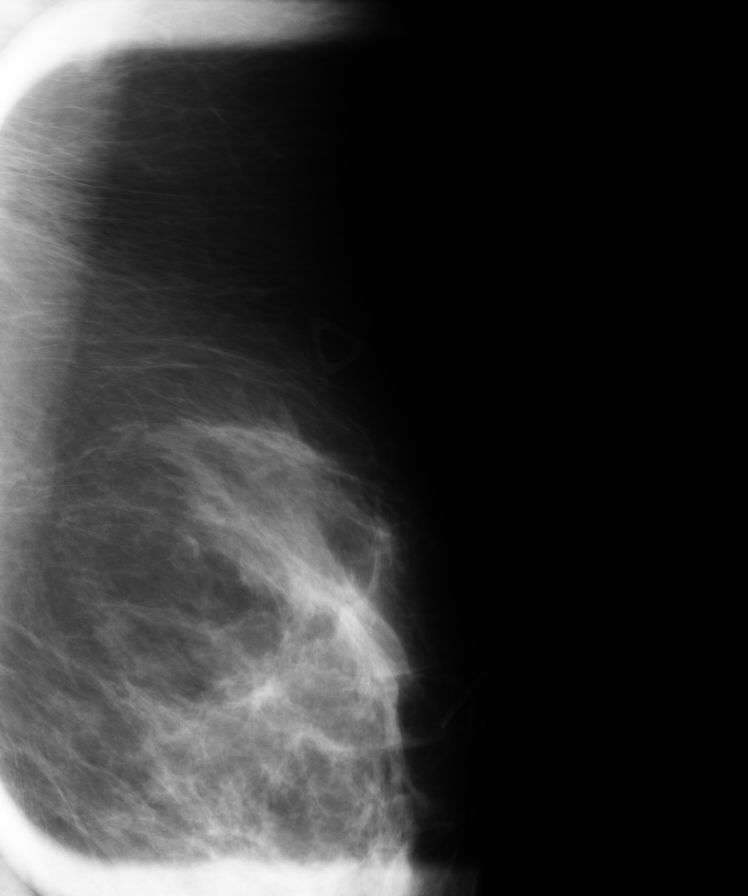

[8 of 8 positions shown; findings below may reference images not displayed]

PROCEDURE:     MAM - MAM TSEGESH DIAGNOSTIC W/CAD  - [DATE]  [DATE]

RESULT:      The breasts demonstrate a heterogeneous parenchymal pattern.
The patient also has a history of bilateral lumpectomies. In the region of
palpable concern, there is heterogeneity within the breast tissue which
effaces with compression. Sonographic evaluation of the region of palpable
concern demonstrates no sonographic abnormalities. These findings are within
the left breast. Within the right breast, an area of increased density with
a nodular appearance is appreciated within the superior portion of the
breast on the MLO view. ML evaluation of right breast demonstrates no
evidence of an ML  correlate. Considering these findings as well as the
patient's history of prior lumpectomy, note this area does project in the
marked area of prior lumpectomy. This finding is likely a benign finding. A
6-month reevaluation of the superior portion of the right breast on MLO view
is recommended to assure stability and/or possible resolution. Mammographic
evaluation of the region of interest within the left breast demonstrates no
sonographic abnormalities.
IMPRESSION: 1.     Likely benign finding within the right breast.
2.     BI-RADS: Category 3-Probably Benign Finding (interval follow up).
3.     6-month reevaluation recommended.
4.     The area of palpable concern within the left breast demonstrates no
sonographic or radiographic abnormalities.

## 2010-10-23 ENCOUNTER — Emergency Department: Payer: Self-pay | Admitting: *Deleted

## 2011-02-11 ENCOUNTER — Ambulatory Visit: Payer: Self-pay | Admitting: Internal Medicine

## 2011-02-19 ENCOUNTER — Ambulatory Visit: Payer: Self-pay | Admitting: Internal Medicine

## 2011-02-19 IMAGING — US ULTRASOUND RIGHT BREAST
1 series · 17 of 18 positions shown · non-contrast
Comparison: none

REASON FOR EXAM: ADLAHO US RT DENSITY
COMMENTS:

[Series 1: ultrasound right breast · 17 of 18 slices shown]
[im 1/18]
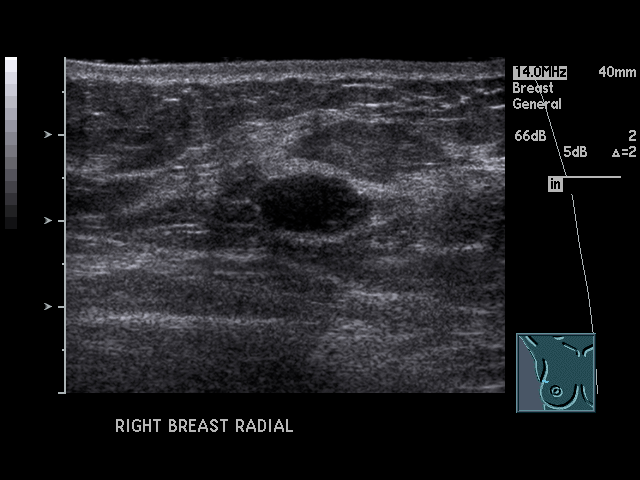
[im 2/18]
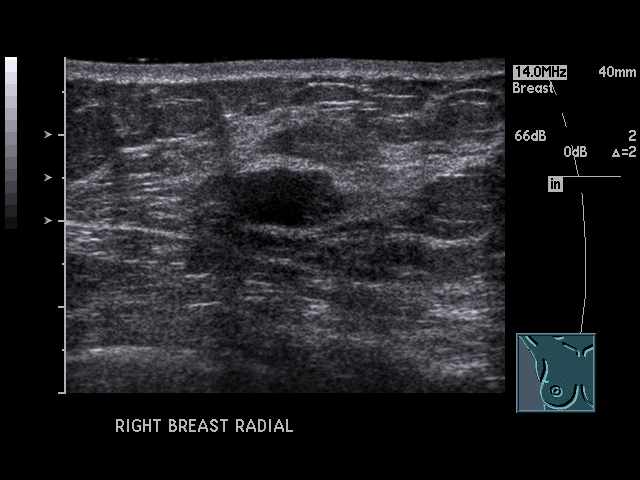
[im 3/18]
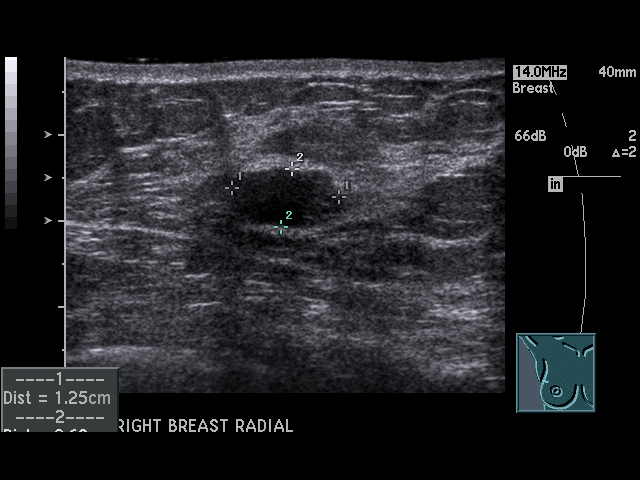
[im 4/18]
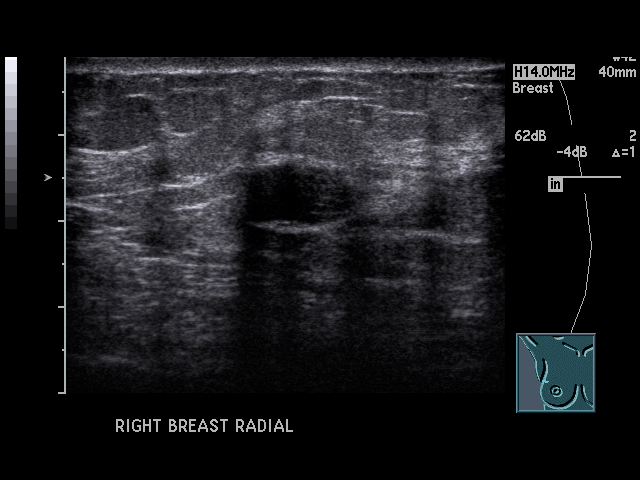
[im 5/18]
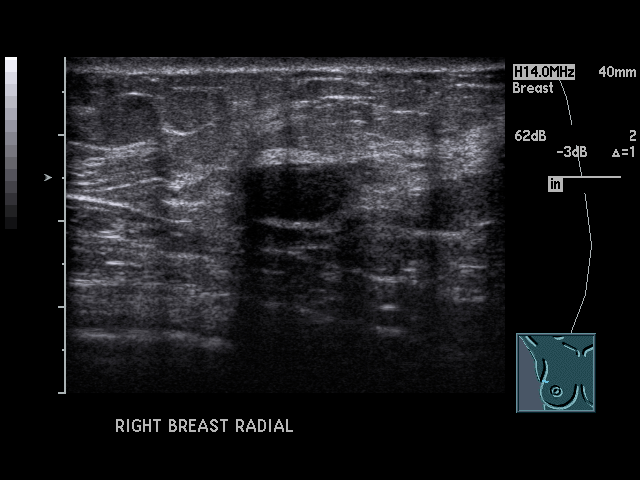
[im 6/18]
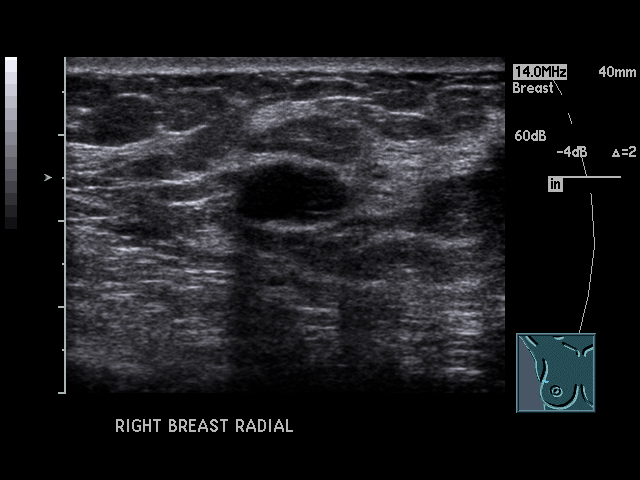
[im 7/18]
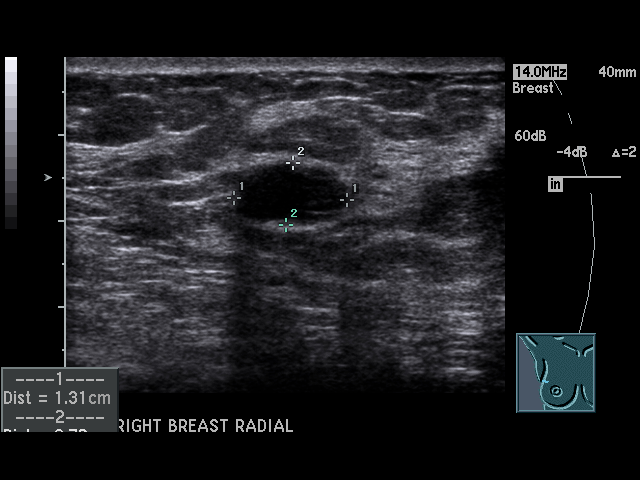
[im 8/18]
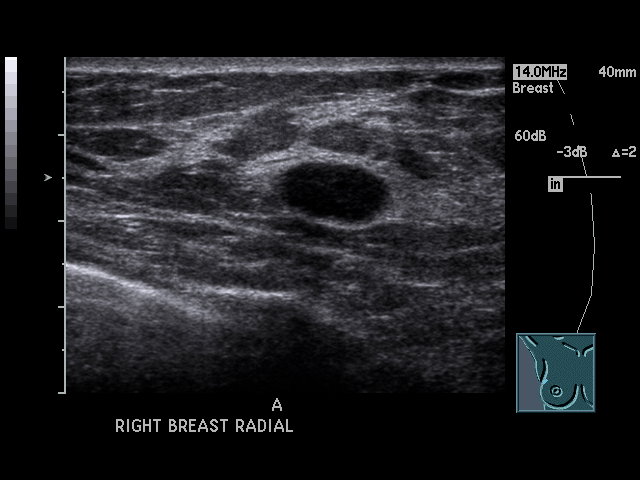
[im 10/18]
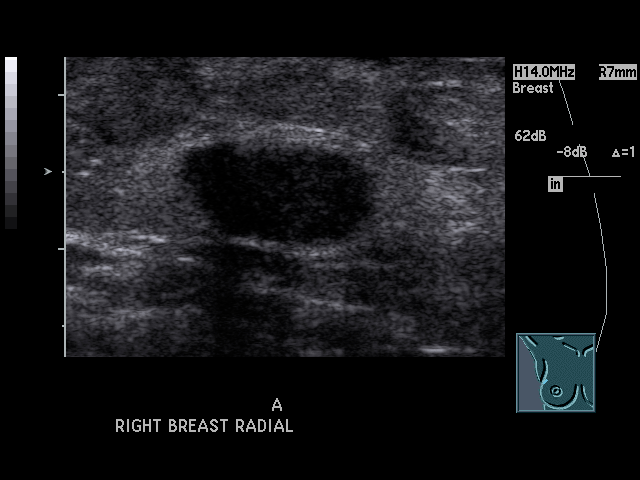
[im 11/18]
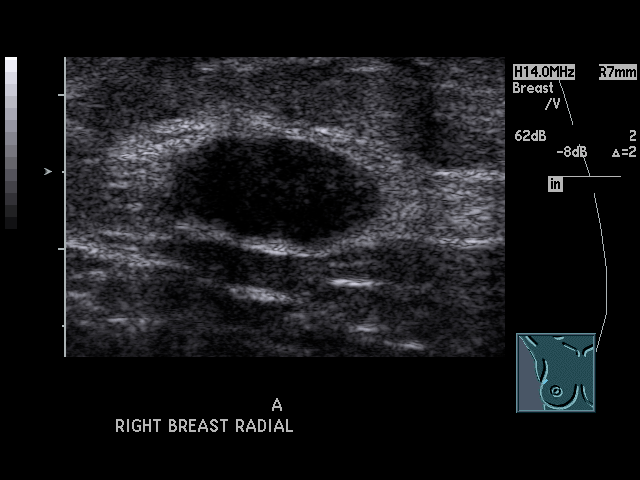
[im 12/18]
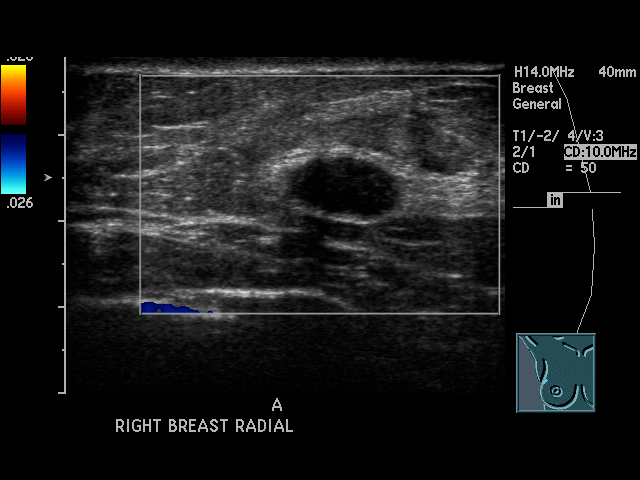
[im 13/18]
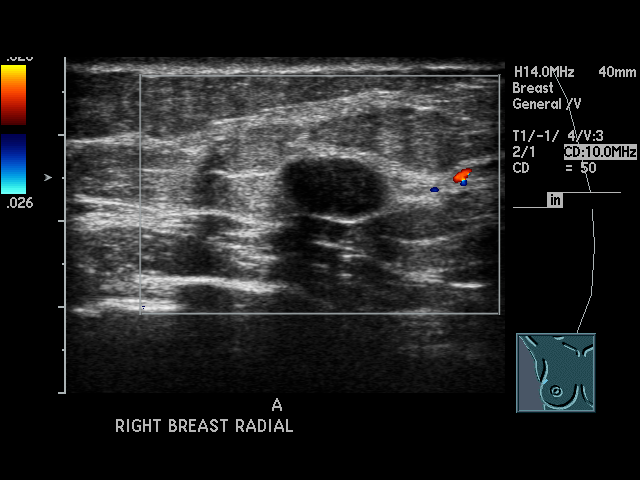
[im 14/18]
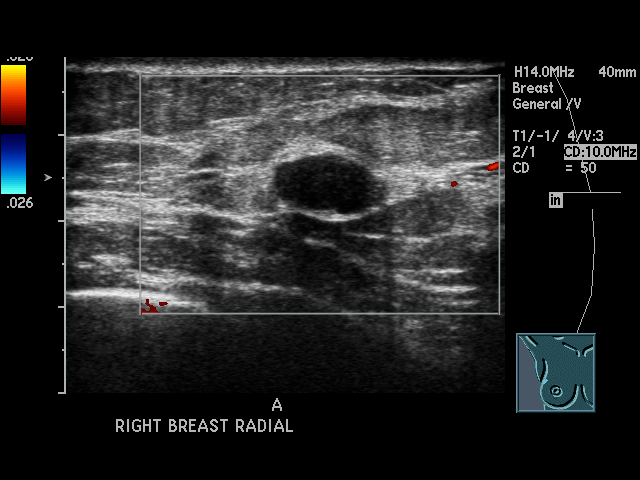
[im 15/18]
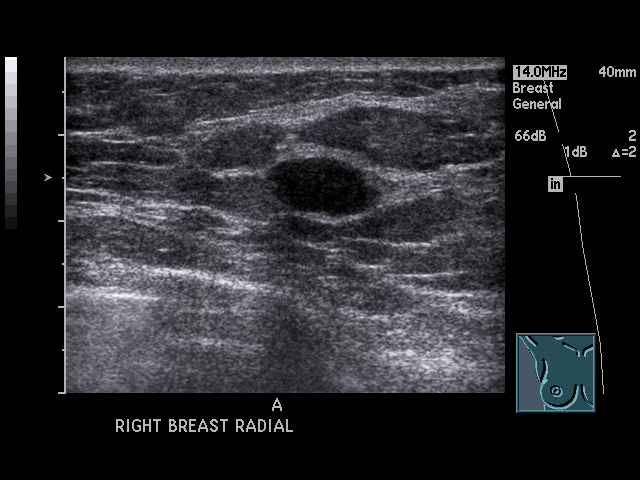
[im 16/18]
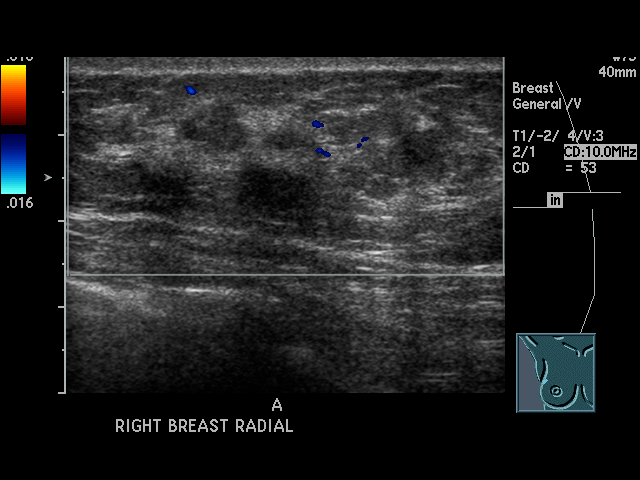
[im 17/18]
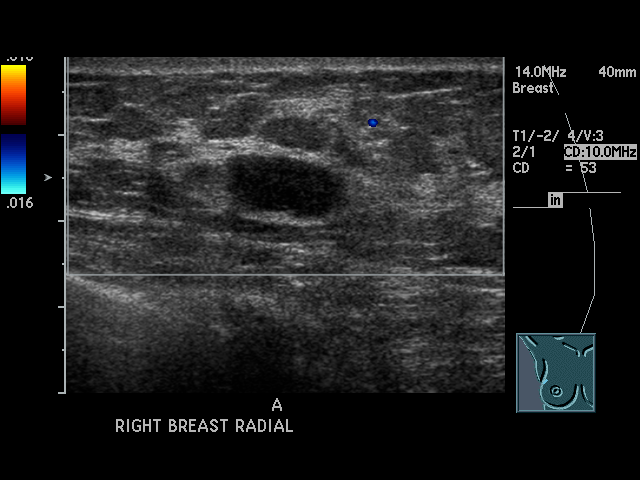
[im 18/18]
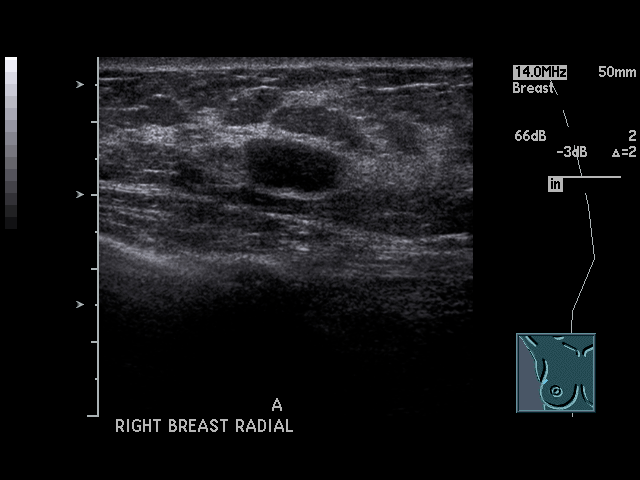

[17 of 18 positions shown; findings below may reference images not displayed]

PROCEDURE:     US  - US ADLAHO RT BREAST  - [DATE]  [DATE]

RESULT:     The right breast is evaluated in the region of interest at the
10 o'clock position. An oval-shaped, primarily smoothly marginated,
hypoechoic to anechoic nodule is identified measuring 1.3 x 0.72 x 1.24 cm.
There is no evidence of peripheral or internal vascularity. This is in a
region of prior lumpectomy and may represent a post op seroma or possibly a
resolving hematoma. Further evaluation in six months is recommended to
re-evaluate for resolution and/or stability.
IMPRESSION: Likely post operative finding within the right breast. Six
month evaluation is recommended.

## 2011-08-04 ENCOUNTER — Ambulatory Visit: Payer: Self-pay | Admitting: Internal Medicine

## 2011-08-04 IMAGING — US US BREAST BILAT
1 series · 17 of 25 positions shown · non-contrast
Comparison: none

REASON FOR EXAM: DEMING HX BRST CA RT BRST DENSITY FU AND LT BR PAIN 3;00
  INTERP
COMMENTS:

[Series 1: us breast bilat · 81 acquisitions, 17 frames shown]
[im 1/81]
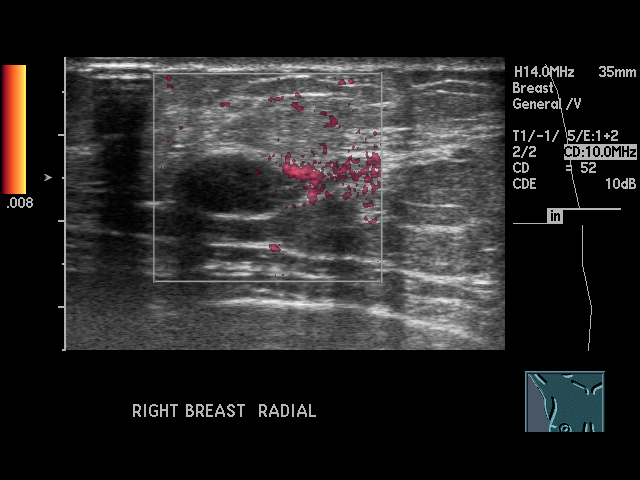
[im 7/81]
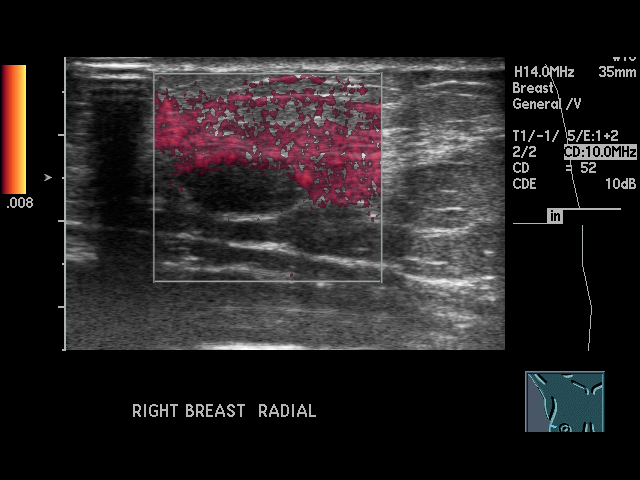
[im 11/81]
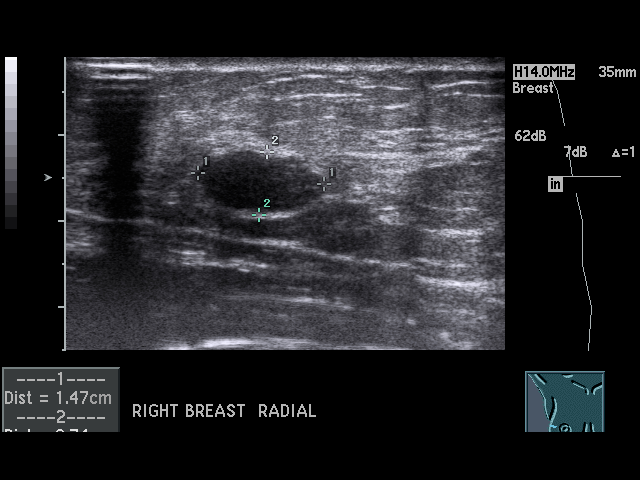
[im 17/81]
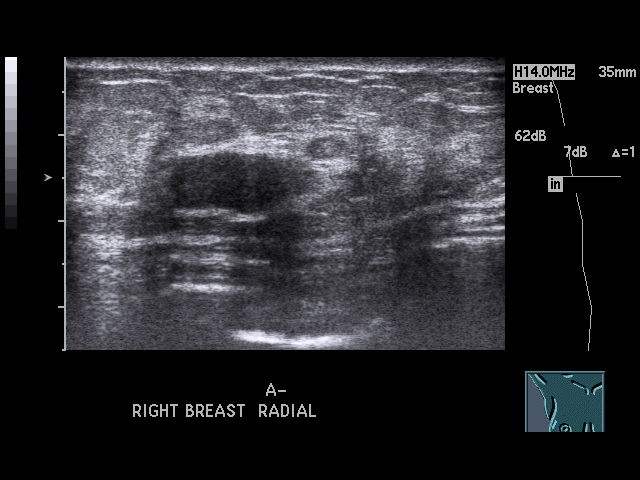
[im 21/81]
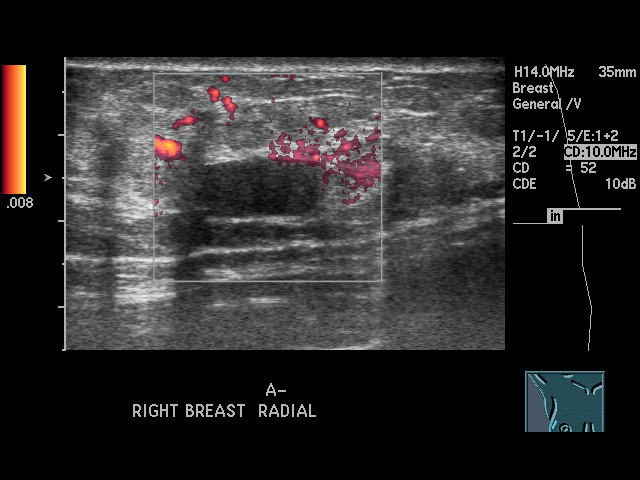
[im 27/81]
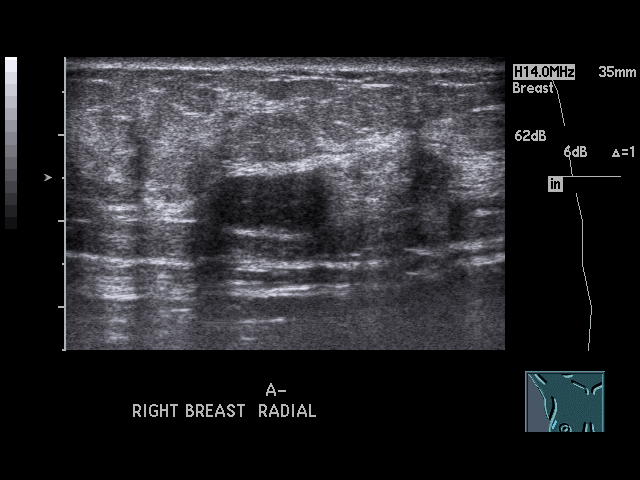
[im 31/81]
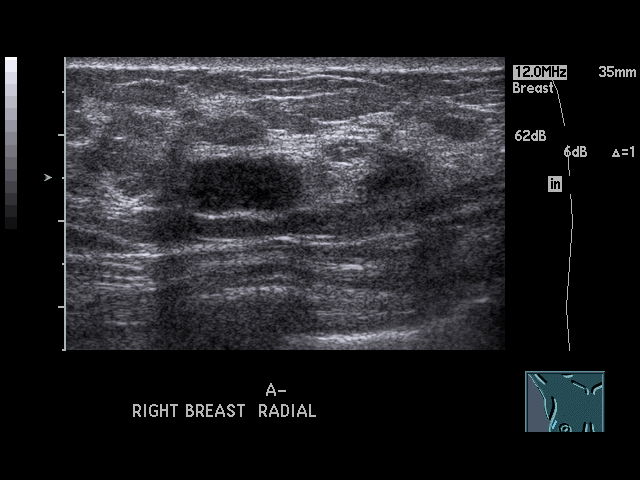
[im 37/81]
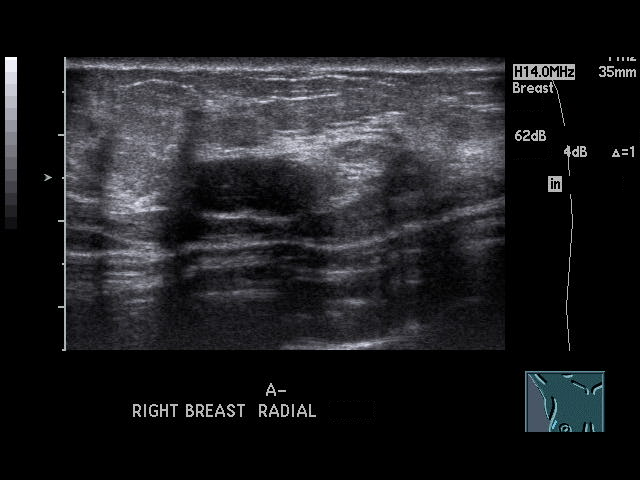
[im 41/81]
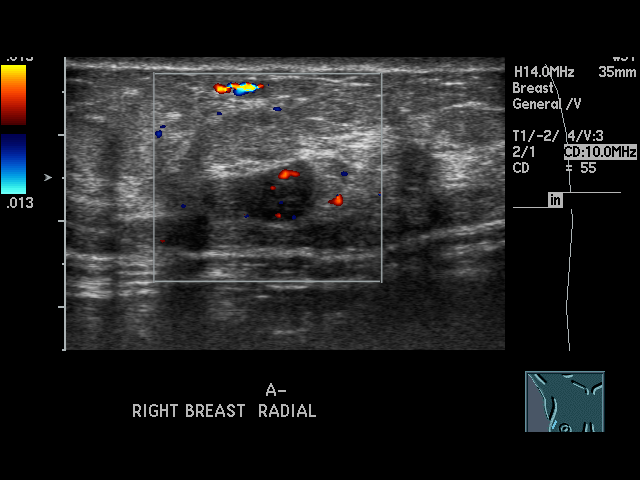
[im 44/81]
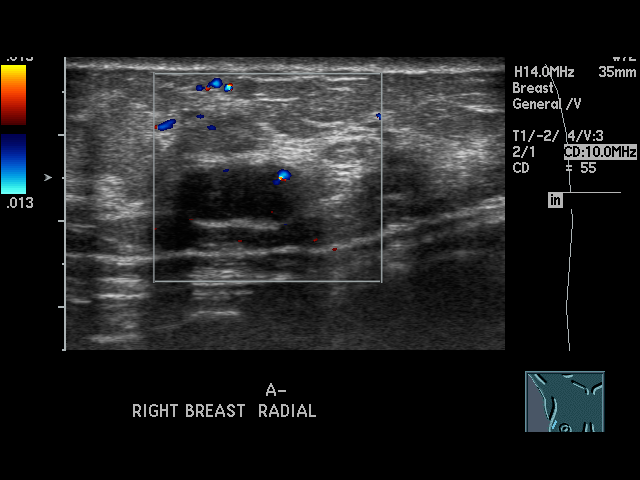
[im 51/81]
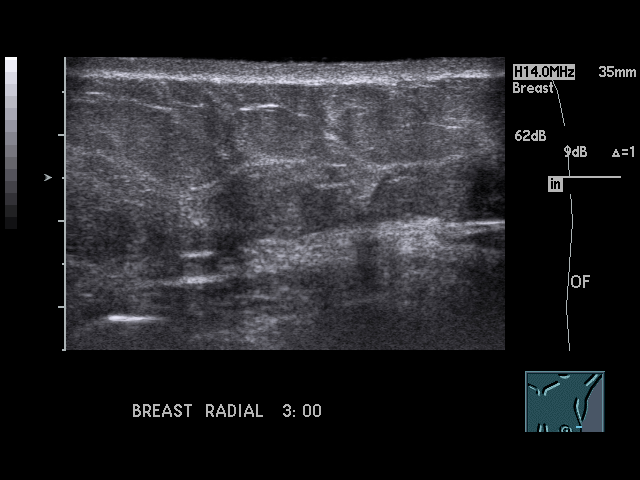
[im 54/81]
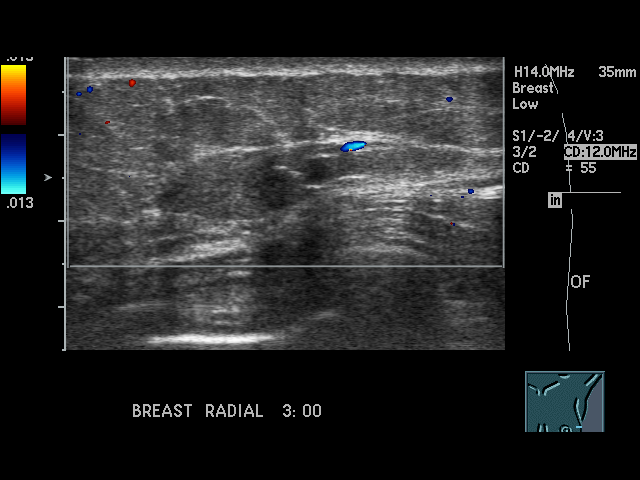
[im 61/81]
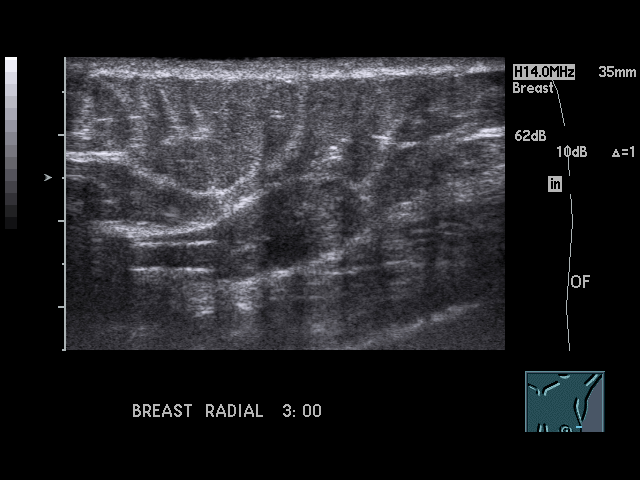
[im 64/81]
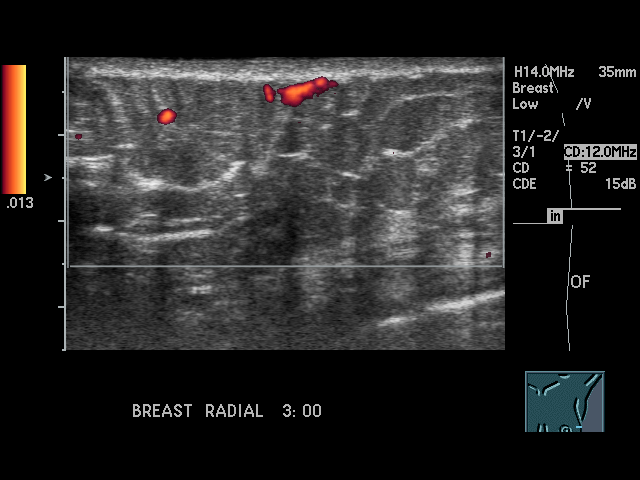
[im 71/81]
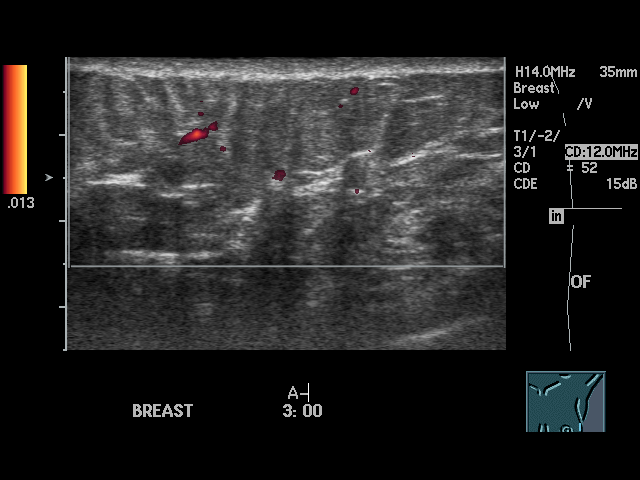
[im 74/81]
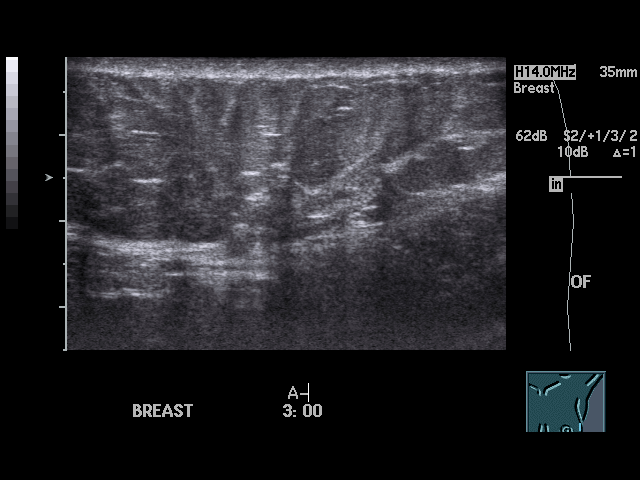
[im 81/81]
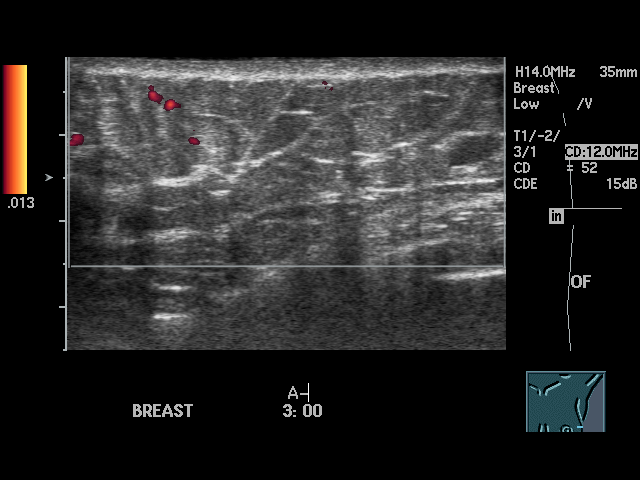

[17 of 25 positions shown; findings below may reference images not displayed]

PROCEDURE:     US  - US BREAST BILATERAL  - [DATE]  [DATE]

RESULT:       Evaluation of the right breast at the 10 o'clock position once
again demonstrates an oval-shaped hypoechoic more solid-appearing nodule. It
measures 1.45 x 0.77 cm.  When compared to the previous study, there appears
to be increased echogenicity within the nodule and areas of possible
peripheral vascularity and slightly increased in size when compared to the
previous measures of 1.3 x 0.72 x 1.24 cm.  The sonographic findings are
indeterminate and further evaluation with surgical consultation is
recommended.

The region of interest within the left breast which demonstrated an area
likely presenting a cyst has involuted in the interim.  There are no solid
or cystic sonographic abnormalities.
IMPRESSION: Indeterminate nodule, right breast.  Please refer to the bilateral digital
diagnostic mammogram for complete discussion.

## 2011-08-19 ENCOUNTER — Ambulatory Visit: Payer: Self-pay

## 2012-06-15 ENCOUNTER — Encounter: Payer: Self-pay | Admitting: *Deleted

## 2012-10-17 ENCOUNTER — Emergency Department: Payer: Self-pay | Admitting: Internal Medicine

## 2012-10-17 LAB — BASIC METABOLIC PANEL
Anion Gap: 5 — ABNORMAL LOW (ref 7–16)
BUN: 10 mg/dL (ref 7–18)
Chloride: 107 mmol/L (ref 98–107)
Co2: 27 mmol/L (ref 21–32)
EGFR (African American): >60
Glucose: 93 mg/dL (ref 65–99)
Osmolality: 276 (ref 275–301)
Potassium: 3.9 mmol/L (ref 3.5–5.1)
Sodium: 139 mmol/L (ref 136–145)

## 2012-10-17 LAB — URINALYSIS, COMPLETE
Bilirubin,UR: NEGATIVE
Nitrite: NEGATIVE
Protein: NEGATIVE
RBC,UR: 1 /HPF (ref 0–5)
Specific Gravity: 1.005 (ref 1.003–1.030)
WBC UR: 2 /HPF (ref 0–5)

## 2012-10-17 LAB — CBC
HCT: 40.1 % (ref 35.0–47.0)
MCH: 29.5 pg (ref 26.0–34.0)
MCHC: 34 g/dL (ref 32.0–36.0)
Platelet: 319 10*3/uL (ref 150–440)
RBC: 4.61 10*6/uL (ref 3.80–5.20)

## 2012-12-23 ENCOUNTER — Ambulatory Visit: Payer: Self-pay

## 2014-02-06 ENCOUNTER — Encounter: Payer: Self-pay | Admitting: *Deleted

## 2014-12-24 ENCOUNTER — Emergency Department
Admission: EM | Admit: 2014-12-24 | Discharge: 2014-12-24 | Disposition: A | Discharge status: Home / Self Care | Payer: Self-pay | Attending: Emergency Medicine | Admitting: Emergency Medicine

## 2014-12-24 ENCOUNTER — Encounter: Payer: Self-pay | Admitting: *Deleted

## 2014-12-24 DIAGNOSIS — Z23 Encounter for immunization: Secondary | ICD-10-CM | POA: Insufficient documentation

## 2014-12-24 DIAGNOSIS — Y92090 Kitchen in other non-institutional residence as the place of occurrence of the external cause: Secondary | ICD-10-CM | POA: Insufficient documentation

## 2014-12-24 DIAGNOSIS — S61211A Laceration without foreign body of left index finger without damage to nail, initial encounter: Secondary | ICD-10-CM | POA: Insufficient documentation

## 2014-12-24 DIAGNOSIS — Z792 Long term (current) use of antibiotics: Secondary | ICD-10-CM | POA: Insufficient documentation

## 2014-12-24 DIAGNOSIS — Y998 Other external cause status: Secondary | ICD-10-CM | POA: Insufficient documentation

## 2014-12-24 DIAGNOSIS — Y93G1 Activity, food preparation and clean up: Secondary | ICD-10-CM | POA: Insufficient documentation

## 2014-12-24 DIAGNOSIS — W260XXA Contact with knife, initial encounter: Secondary | ICD-10-CM | POA: Insufficient documentation

## 2014-12-24 DIAGNOSIS — Z880 Allergy status to penicillin: Secondary | ICD-10-CM | POA: Insufficient documentation

## 2014-12-24 MED ORDER — TETANUS-DIPHTH-ACELL PERTUSSIS 5-2.5-18.5 LF-MCG/0.5 IM SUSP
0.5000 mL | Freq: Once | INTRAMUSCULAR | Status: AC
  Administered 2014-12-24: 0.5 mL via INTRAMUSCULAR
  Filled 2014-12-24: qty 0.5

## 2014-12-24 MED ORDER — SULFAMETHOXAZOLE-TRIMETHOPRIM 800-160 MG PO TABS: 1.0000 | ORAL_TABLET | Freq: Two times a day (BID) | ORAL | Status: DC

## 2014-12-24 NOTE — ED Provider Notes (Signed)
CSN: [id number]     Arrival date & time 12/24/14  1742 History   None    Chief Complaint  Patient presents with  . Laceration     (Consider location/radiation/quality/duration/timing/severity/associated sxs/prior Treatment) HPI  39 year old female presents to the emergency department for laceration to the dorsal aspect of left index finger at the PIP joint just prior to arrival today. She was cutting with a knife in the kitchen, cut her left dorsal index finger at PIP joint. Pain is moderate. Bleeding is controlled. Tetanus is not known to be up-to-date. She does not take any medications for pain. She is able to fully flex and extend digit.  Past Medical History  Diagnosis Date  . Asthma 2010  . Depression   . Lump or mass in breast 2010   Past Surgical History  Procedure Laterality Date  . Breast surgery Bilateral 2000    bilateral-cyst removed    History reviewed. No pertinent family history. Social History  Substance Use Topics  . Smoking status: Never Smoker   . Smokeless tobacco: None  . Alcohol Use: No   OB History    Gravida Para Term Preterm AB TAB SAB Ectopic Multiple Living   4 3   1  1   3       Obstetric Comments     FIRST PREGNANCY 15 FIRST MENSTRUAL 12     Review of Systems  Constitutional: Negative for fever, chills, activity change and fatigue.  HENT: Negative for congestion, sinus pressure and sore throat.   Eyes: Negative for visual disturbance.  Respiratory: Negative for cough, chest tightness and shortness of breath.   Cardiovascular: Negative for chest pain and leg swelling.  Gastrointestinal: Negative for nausea, vomiting, abdominal pain and diarrhea.  Genitourinary: Negative for dysuria.  Musculoskeletal: Negative for arthralgias and gait problem.  Skin: Positive for wound. Negative for rash.  Neurological: Negative for weakness, numbness and headaches.  Hematological: Negative for adenopathy.  Psychiatric/Behavioral: Negative for behavioral  problems, confusion and agitation.      Allergies  Amoxicillin; Aspirin; Augmentin; Ibuprofen; Nsaids; and Penicillins  Home Medications   Prior to Admission medications   Medication Sig Start Date End Date Taking? Authorizing Provider  sulfamethoxazole-trimethoprim (BACTRIM DS,SEPTRA DS) 800-160 MG per tablet Take 1 tablet by mouth 2 (two) times daily. 12/24/14   Duanne Guess, PA-C   BP 145/93 mmHg  Pulse 79  Temp(Src) 98.7 F (37.1 C) (Oral)  Resp 20  Ht 5\' 5"  (1.651 m)  Wt 180 lb (81.647 kg)  BMI 29.95 kg/m2  SpO2 99% Physical Exam  Constitutional: She is oriented to person, place, and time. She appears well-developed and well-nourished. No distress.  HENT:  Head: Normocephalic and atraumatic.  Mouth/Throat: Oropharynx is clear and moist.  Eyes: EOM are normal. Pupils are equal, round, and reactive to light. Right eye exhibits no discharge. Left eye exhibits no discharge.  Neck: Normal range of motion. Neck supple.  Cardiovascular: Normal rate and intact distal pulses.   Pulmonary/Chest: No respiratory distress.  Musculoskeletal:  Examination of left hand shows patient has full composite fist. There is a small one similar laceration on the dorsal aspect of the left index finger, dorsal to the PIP joint. No active bleeding. Patient has full active extension and flexion of the PIP joint as well as DIP joints of the left index. Patient's neuro rest intact left upper extremity.  Neurological: She is alert and oriented to person, place, and time. She has normal reflexes.  Skin: Skin  is warm and dry.  Psychiatric: She has a normal mood and affect. Her behavior is normal. Thought content normal.    ED Course  Procedures (including critical care time) Dermabond: Dermabond was applied topically to the dorsal aspect of the left PIP joint to hold laceration together. Dermabond was applied with the PIP joint and full flexion. Prior to Dermabond application, skin was prepped with  Betadine and saline. He tolerated the procedure well. No foreign body seen.  Labs Review Labs Reviewed - No data to display  Imaging Review No results found. I have personally reviewed and evaluated these images and lab results as part of my medical decision-making.   EKG Interpretation None      MDM   Final diagnoses:  Laceration of left index finger w/o foreign body w/o damage to nail, initial encounter    39 year old female with small one centimeter laceration to the dorsal aspect the left PIP joint. Ibuprofen Tylenol for pain. Due to proximity to the joint, placed on antibiotic's. Educated on Dermabond wound care. Return to the ER for any increased pain and swelling warmth or erythema    Duanne Guess, PA-C 12/24/14 1857  Earleen Newport, MD 12/24/14 [phone]

## 2014-12-24 NOTE — Discharge Instructions (Signed)
Cuidados de Risk manager - Adultos  (Laceration Care, Adult)  Una herida cortante es un corte o lesin que atraviesa todas las capas de la piel y el tejido que se encuentra debajo de la piel.  TRATAMIENTO  Algunas laceraciones no requieren sutura. Algunas no deben cerrarse debido a que puede aumentar el riesgo de infeccin. Es importante que consulte al mdico lo antes posible despus de recibir una lesin para minimizar el riesgo de infeccin y aumentar la posibilidad de que se cierre con xito.  Cuando se cierra adecuadamente, podrn indicarle analgsicos, si los necesita. La herida debe limpiarse para combatir la infeccin. El mdico usar puntos (suturas), [place], o tiras [place] para Equities trader. Estos elementos mantendrn unidos los bordes de la piel para que se cure ms rpidamente y para un mejor resultado cosmtico. Sin embargo, todas las heridas se curarn con una cicatriz. Una vez que la herida se haya curado, las cicatrices pueden minimizarse cubriendo la herida con pantalla solar durante el da por un lapso se 1 ao.  INSTRUCCIONES PARA EL CUIDADO EN EL HOGAR  Si tiene puntos o grapas:   Mantenga la herida limpia y [place].  Si tiene un (vendaje) cmbielo al menos una vez al da. Cmbielo si se moja o se ensucia, o segn las indicaciones del [place] veces por da con agua y [place]. Enjuguelo con agua para quitar todo el [place]. Seque dando palmaditas con una toalla limpia y seca.  Despus de limpiar, aplique una delgada capa de una crema con antibitico segn las indicaciones del mdico. Esto le ayudar a prevenir las infecciones y a Product/process development scientist que el vendaje se Designer, fashion/clothing.  Puede ducharse despus de las primeras 24 horas. No remoje la herida en agua hasta que le hayan quitado los puntos.  Solo tome medicamentos que se pueden comprar sin receta o recetados para Conservation officer, historic buildings, Tree surgeon o fiebre, como le indica el mdico.  Concurra para que le retiren los  puntos o las grapas cuando el mdico le indique. En caso que tenga tiras IRSWNIOEV:   Mantenga la herida limpia y seca.  No deje que las tiras se mojen. Puede darse un bao cuidando de [employer] herida seca.  Si se moja, squela dando palmaditas con una toalla limpia.  Las tiras caern por s mismas. Puede recortar las tiras a medida que la herida se [place]. No quite las tiras que estn pegadas a la herida. Ellas se caern cuando sea el momento. En caso que le hayan [place].   Podr mojara momentneamente la herida en la ducha o el bao. No frote ni sumerja la herida. No practique natacin. Evite transpirar con abundancia hasta que el [place] se haya cado. Despus de ducharse o darse un bao, seque el corte dando palmaditas con una toalla limpia.  No aplique medicamentos lquidos, en crema o ungentos mientras el [employer] est en su lugar. Podr aflojarlo antes de que la herida se cure.  Si tiene un vendaje, tenga cuidado de no aplicar cinta adhesiva directamente [employer]. Esto puede hacer que el [place] se caiga antes de que la herida se haya curado.  Evite la exposicin prolongada a la luz del sol o a la Event organiser en [employer] se Network engineer. La exposicin a los Radiographer, therapeutic la Radio broadcast assistant.  El [employer] la piel durante 5 a 10 das y Loss adjuster, chartered. No quite la pelcula de [place]. Deber aplicarse  la vacuna contra el ttanos si:  No recuerda cundo se coloc la vacuna la ltima vez.  Nunca recibi esta vacuna. Si le han aplicado la vacuna contra el ttanos, el brazo podr hincharse, enrojecer y sentirse caliente al tacto. Esto es frecuente y no es un problema. Si usted necesita aplicarse la vacuna y se niega a recibirla, corre riesgo de contraer ttanos. sta es una enfermedad grave.  SOLICITE ATENCIN MDICA SI:   Presenta enrojecimiento, hinchazn o aumento del dolor en la  herida.  Hay rayas rojas que salen de la herida.  Observa un lquido blanco amarillento (pus) en la herida.  Tiene fiebre.  Advierte un olor ftido que proviene de la herida o del vendaje.  La herida se abre luego de que le han extrado las suturas.  Nota que en la herida hay algn cuerpo extrao como un trozo de [place] o vidrio.  La herida est en su mano o pie y observa que no puede mover correctamente los dedos. SOLICITE ATENCIN MDICA DE INMEDIATO SI:   El dolor no se alivia con los [employer].  Hay una zona muy hinchada alrededor de la herida que le causa dolor y adormecimiento, o advierte un cambio en el color en el brazo, la mano, la pierna o el pie.  La herida se abre y sangra nuevamente.  Siente que el adormecimiento, la debilidad o la prdida de la funcin de la articulacin que rodea la herida [place].  Palpa ndulos dolorosos cerca de la herida o bajo la piel en cualquier zona del cuerpo. ASEGRESE DE QUE:   Comprende estas instrucciones.  Controlar su enfermedad.  Solicitar ayuda de inmediato si no mejora o si empeora. Document Released: 03/24/2005 Document Revised: 06/16/2011 [facility] Patient Information 2015 [place]. This information is not intended to replace advice given to you by your health care provider. Make sure you discuss any questions you have with your health care provider.

## 2014-12-24 NOTE — ED Notes (Signed)
AAOx3.  Skin warm and dry.  NAD.  D/C home 

## 2014-12-24 NOTE — ED Notes (Signed)
Pt was slicing chicken and cut her left index finger

## 2015-04-08 HISTORY — PX: BREAST BIOPSY: SHX20

## 2017-06-06 IMAGING — MG US  BREAST BX W/ LOC DEV 1ST LESION IMG BX SPEC US GUIDE*R*
1 series · 8 of 8 positions shown · non-contrast
Comparison: Previous exam(s).

ADDENDUM:
PATHOLOGY ADDENDUM:

Pathology: Fibroadenoma
Pathology concordant with imaging findings: Yes
Recommendation: Yearly screening mammography.
At the request of the patient, I spoke with her by telephone on
[DATE] at [DATE] p.m.. She reports doing well after the biopsy
with minimal pain.
The findings and recommendations were discussed with the patient,
with the help of an interpreter..
CLINICAL DATA: Right breast 9 o'clock mass
EXAM:
ULTRASOUND GUIDED RIGHT BREAST CORE NEEDLE BIOPSY

[Series 1: MG view · 0.06mm/px · 8 of 11 slices shown]
[im 1/11]
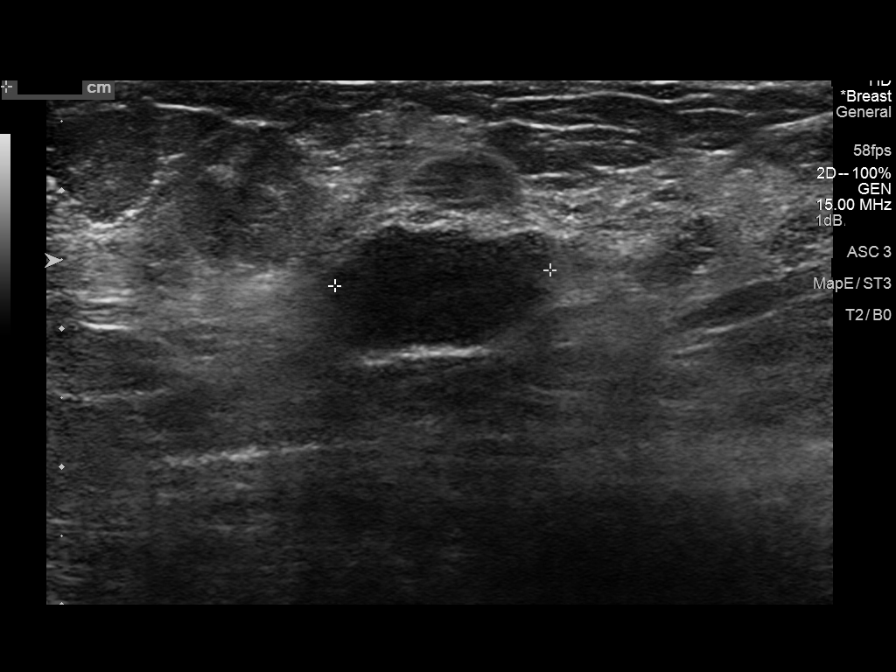
[im 2/11]
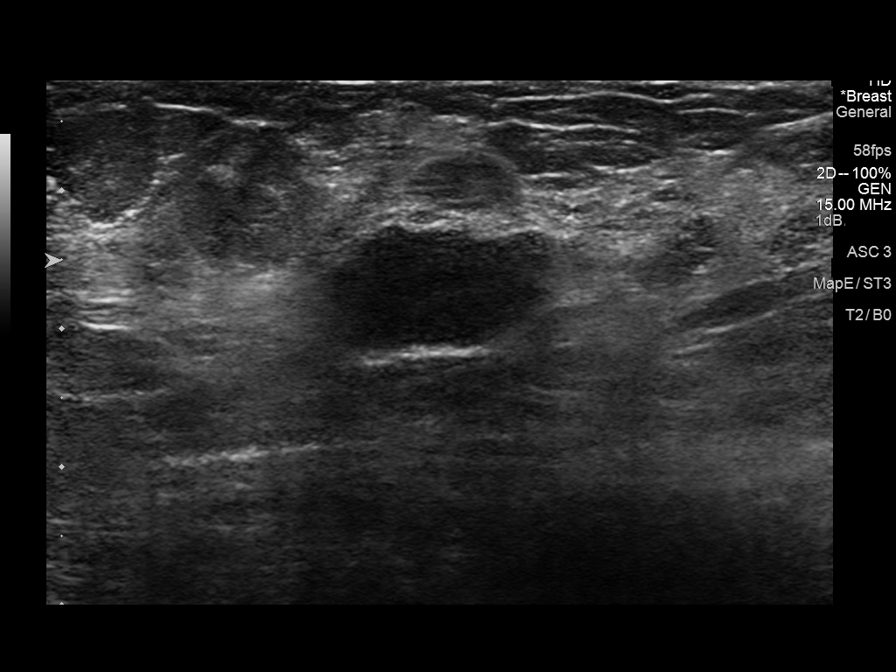
[im 3/11]
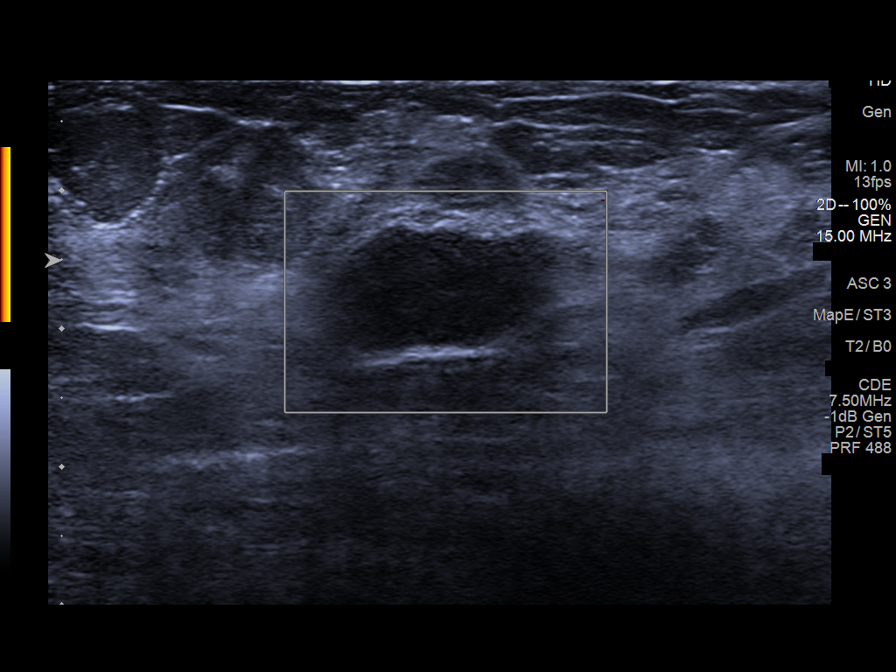
[im 5/11]
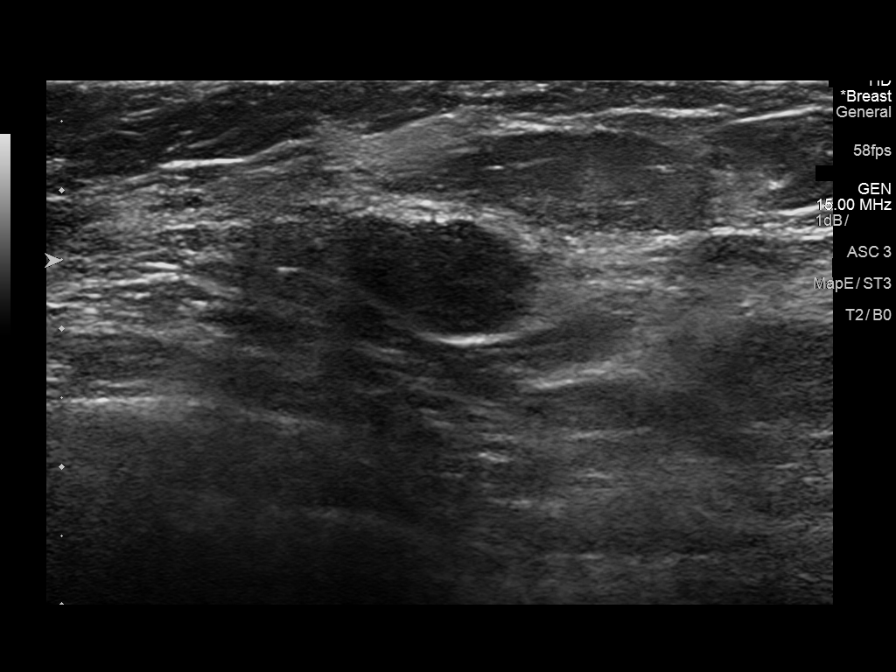
[im 6/11]
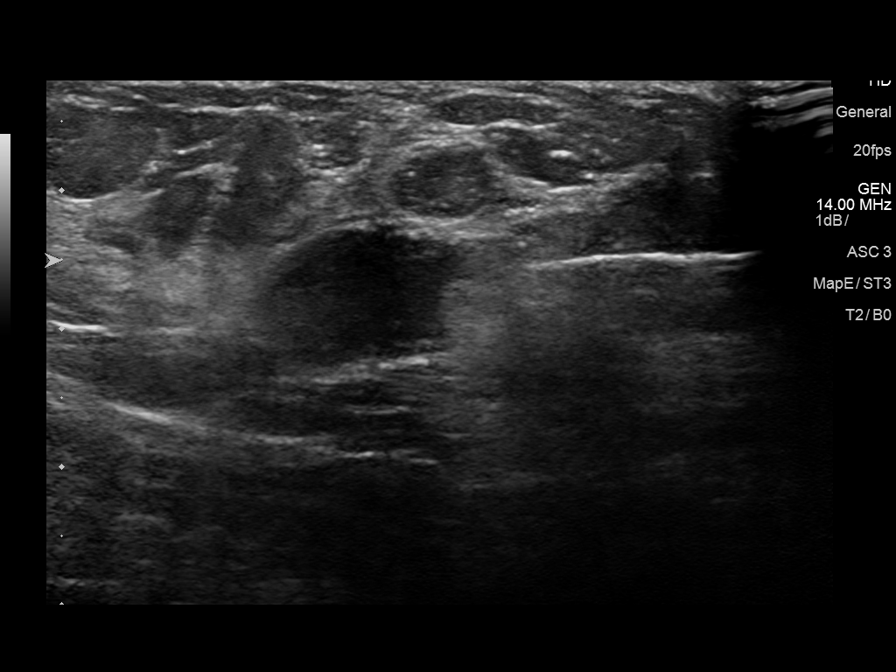
[im 8/11]
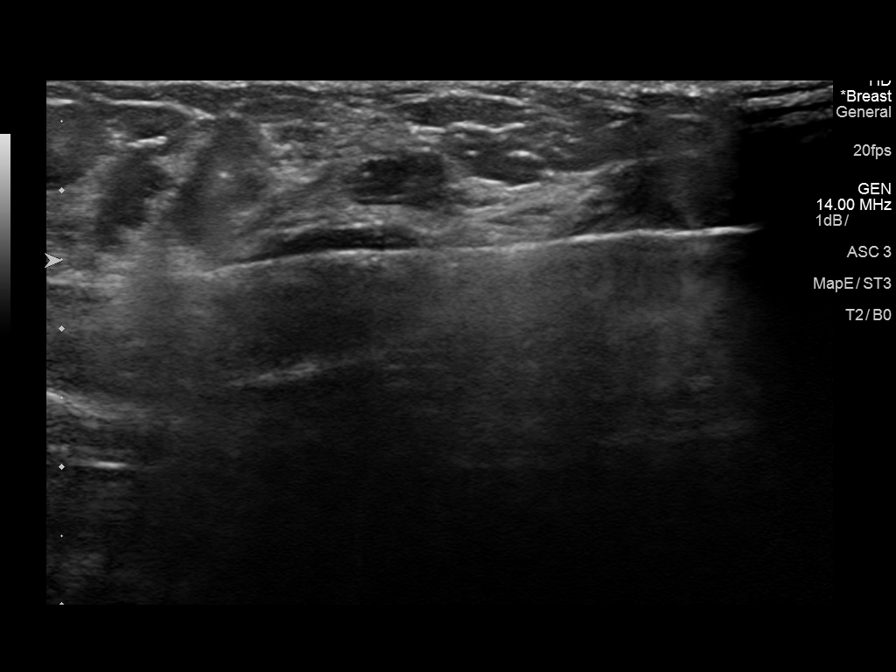
[im 9/11]
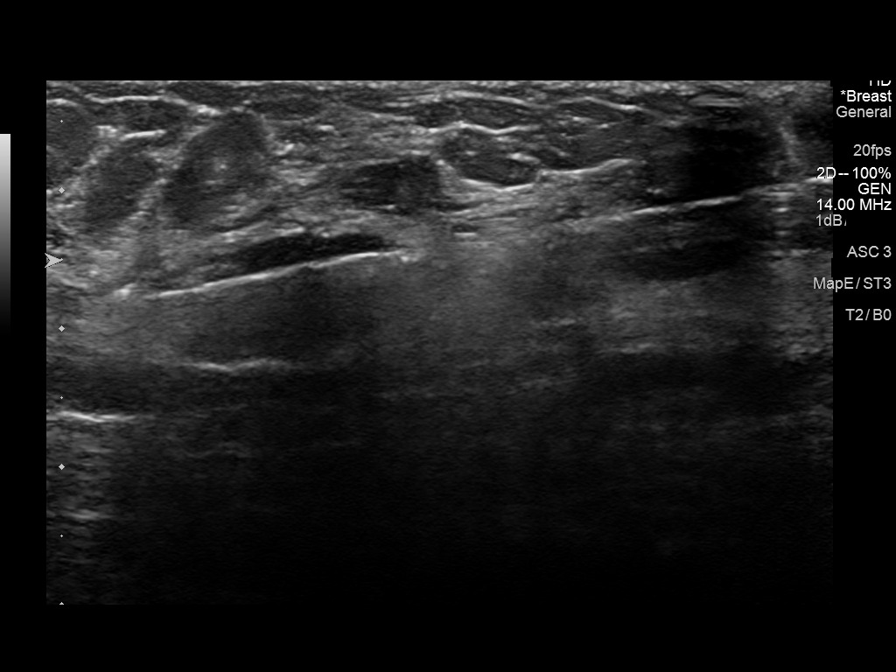
[im 11/11]
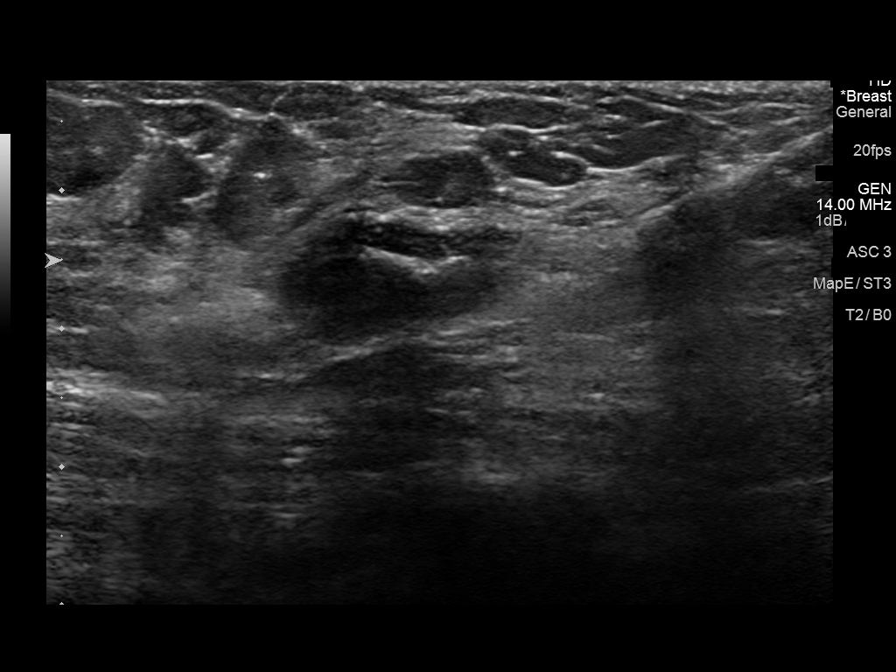

[8 of 8 positions shown; findings below may reference images not displayed]



Lesion quadrant: Upper outer quadrant

Using sterile technique and 1% Lidocaine as local anesthetic, under
direct ultrasound visualization, a 14 gauge JUZRYLL device was
used to perform biopsy of right breast 9 o'clock mass using a
inferior approach. At the conclusion of the procedure a coil shaped
tissue marker clip was deployed into the biopsy cavity. Follow up 2
view mammogram was performed and dictated separately.
IMPRESSION: Ultrasound guided biopsy of right breast. No apparent complications.

## 2017-06-17 ENCOUNTER — Ambulatory Visit: Payer: Self-pay | Attending: Oncology

## 2017-06-17 VITALS — BP 139/93 | HR 90 | Temp 98.3°F

## 2017-06-17 DIAGNOSIS — N63 Unspecified lump in unspecified breast: Secondary | ICD-10-CM

## 2017-06-18 IMAGING — US US BREAST*R* LIMITED INC AXILLA
2 series · 9 of 9 positions shown · non-contrast
Comparison: Previous exam(s).

CLINICAL DATA: Bilateral upper outer quadrants areas of palpable
concern felt by the patient. History of benign excisional biopsies
bilaterally. History of core needle biopsy of right breast mass.

EXAM:
DIGITAL DIAGNOSTIC BILATERAL MAMMOGRAM WITH CAD AND TOMO
ULTRASOUND BILATERAL BREAST

[Series 1: us breast*right* limited inc axilla · 0.06mm/px · 8 of 8 slices shown (1 of 2)]
[im 1/8]
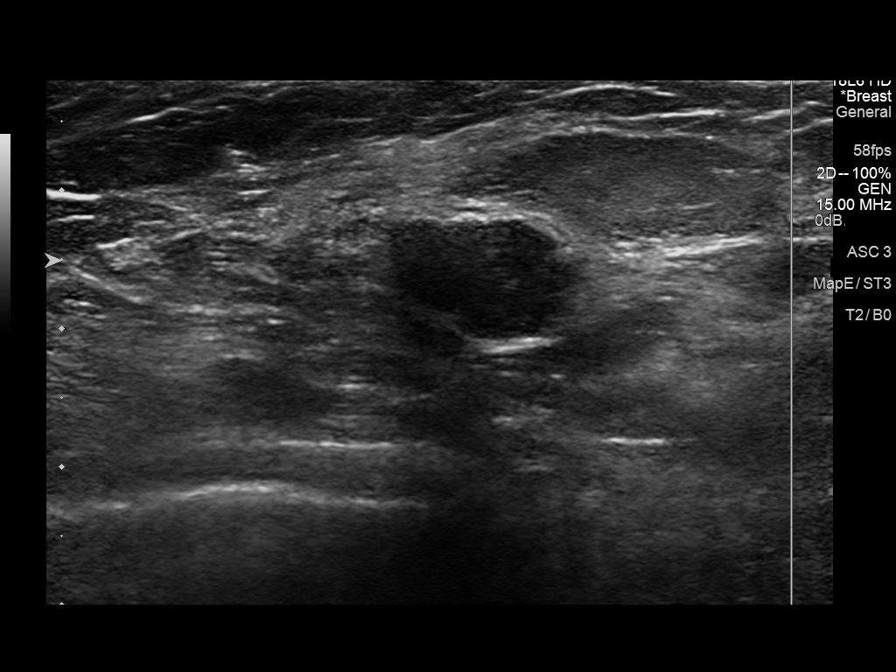
[im 2/8]
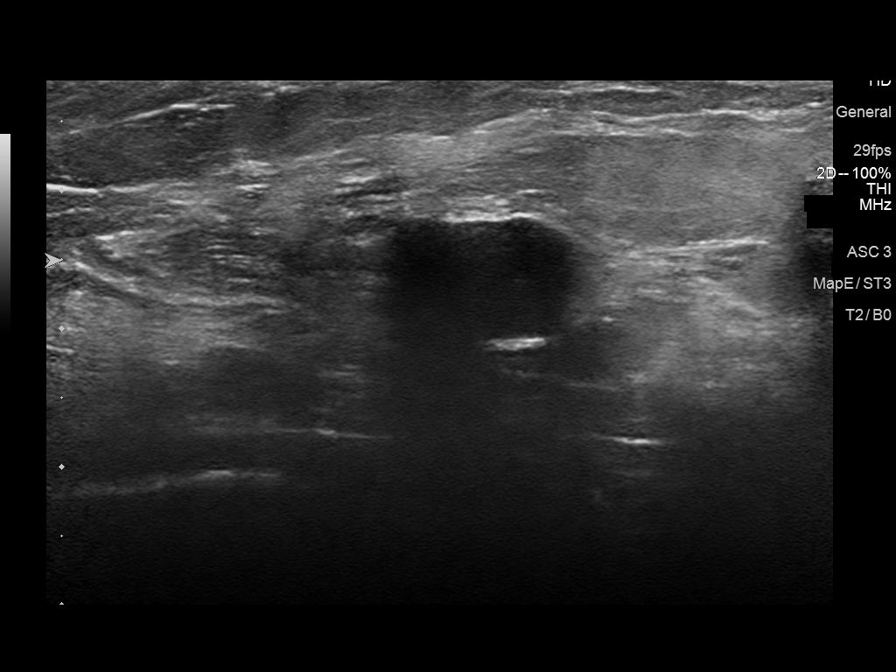
[im 3/8]
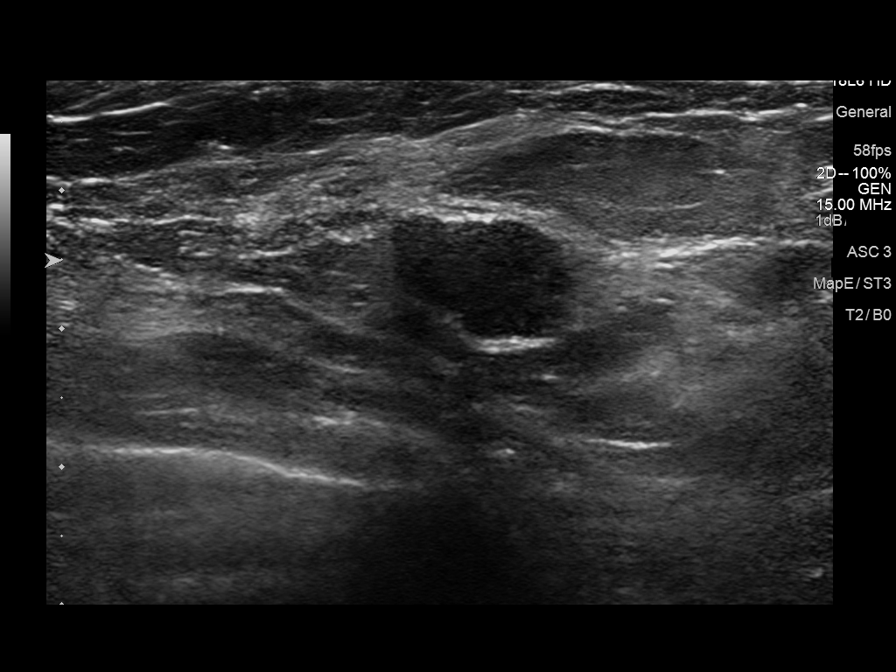
[im 4/8]
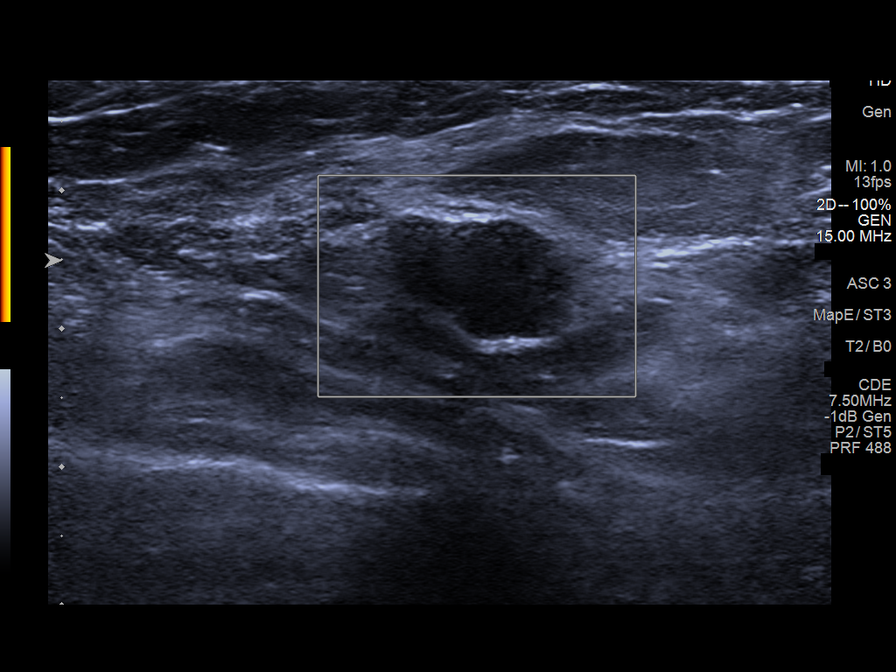
[im 5/8]
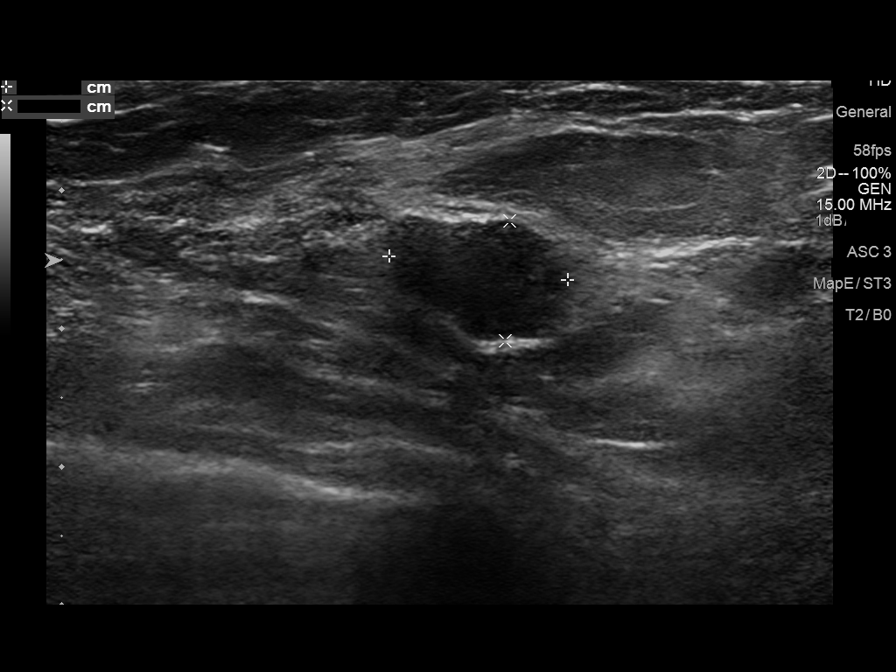
[im 6/8]
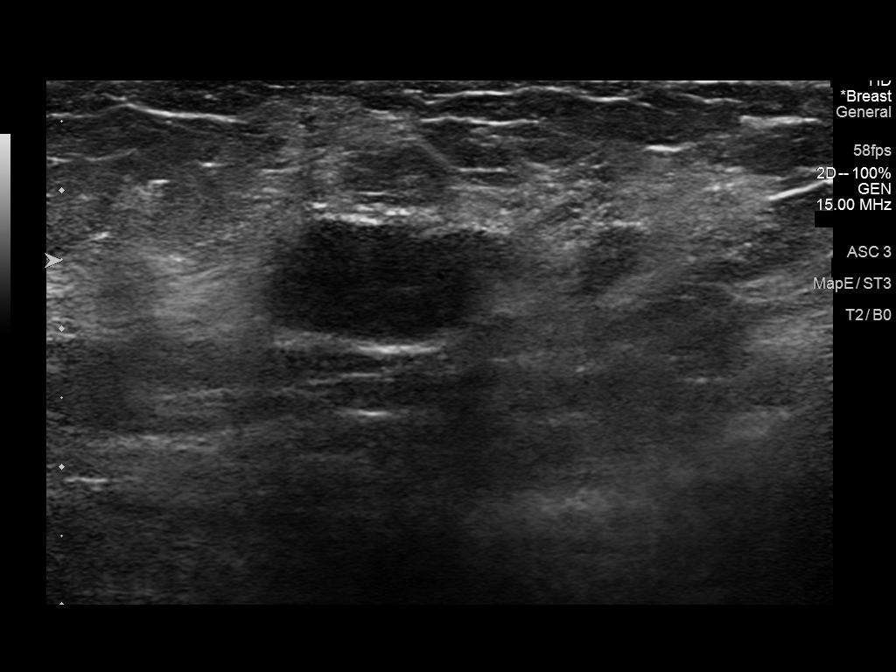
[im 7/8]
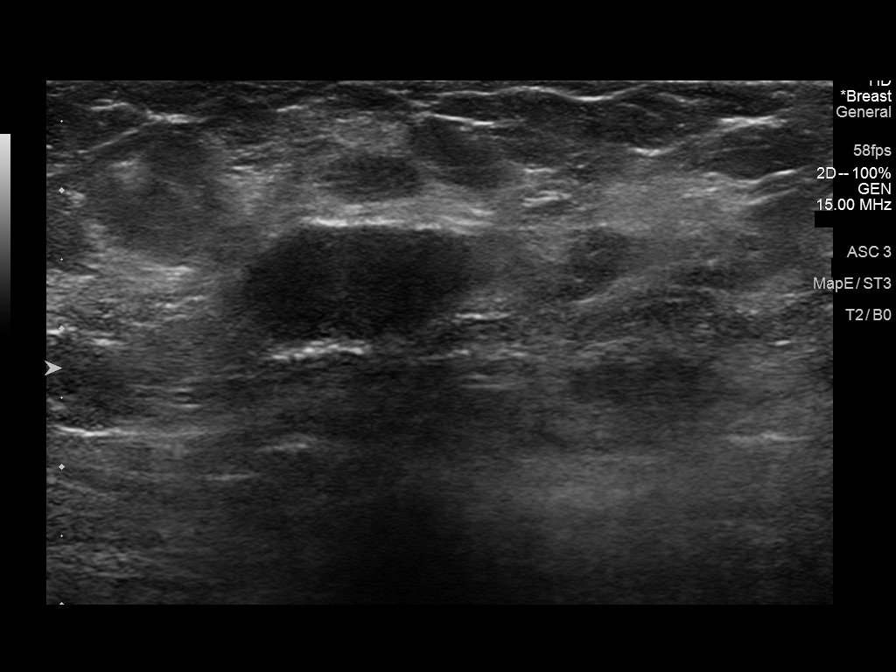
[im 8/8]
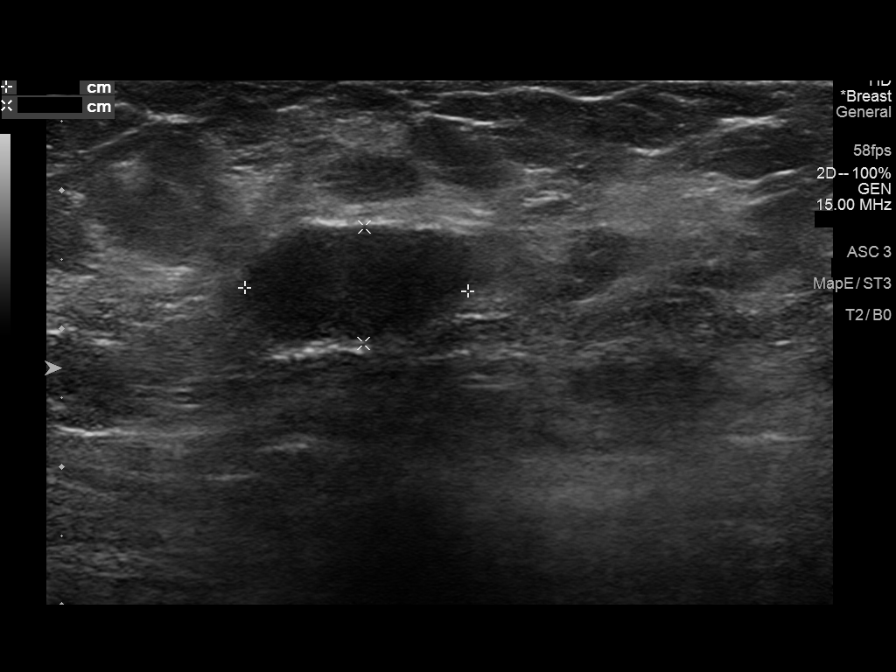

[Series 2: us breast*right* limited inc axilla · 0.06mm/px · 1 of 1 slices shown (2 of 2)]
[im 1/1]
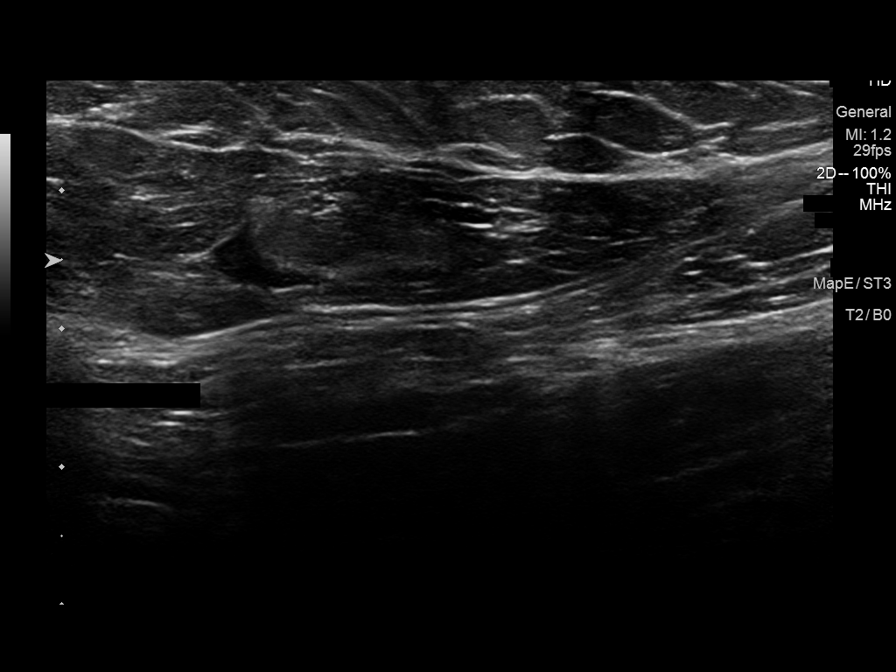

[9 of 9 positions shown; findings below may reference images not displayed]

ACR Breast Density Category c: The breast tissue is heterogeneously
dense, which may obscure small masses.
FINDINGS: Mammographically, there is a lobulated isodense to breast parenchyma
mass in the right breast upper outer quadrant, posterior depth,
which corresponds to 1 of the palpable areas described by the
patient.

Mammographically, there are no suspicious masses, areas of
nonsurgical architectural distortion or microcalcifications in the
left breast. There are bilateral postsurgical changes.

Mammographic images were processed with CAD.

On physical exam, there is a moderately firm palpable rounded mass
in the right breast upper outer quadrant. No other focal
abnormalities are palpated.

Targeted left breast ultrasound is performed, showing no suspicious
masses or shadowing lesions in the left breast.

Targeted right breast ultrasound is performed, showing 9 o'clock 6
cm from the nipple hypoechoic circumscribed lobulated mass measuring
1.6 x 0.8 x 1.3 cm. This mass corresponds to the mammographically
seen previously biopsied mass, however it demonstrates slight
increase in size from [AR] when it measured 1.2 x 1.3 x 0.7 cm. No
evidence of right axillary lymphadenopathy.
IMPRESSION: Right breast probably benign mass which however demonstrates mild
increase in size, because of which ultrasound-guided core needle
biopsy is recommended.

No mammographic or sonographic evidence of malignancy in the left
breast.

RECOMMENDATION:
Ultrasound-guided core needle biopsy of the right breast.

Otherwise, the further management of the bilateral breasts areas of
palpable concern should be based on clinical grounds.

I have discussed the findings and recommendations with the patient.
Results were also provided in writing at the conclusion of the
visit. If applicable, a reminder letter will be sent to the patient
regarding the next appointment.

BI-RADS CATEGORY  3: Probably benign.

## 2017-06-18 NOTE — Progress Notes (Signed)
Subjective:     Patient ID: Deanna Hall, female   DOB: 02/11/1976, 42 y.o.   MRN: [id number]  HPI   Review of Systems     Objective:   Physical Exam  Pulmonary/Chest: Right breast exhibits mass and tenderness. Right breast exhibits no inverted nipple, no nipple discharge and no skin change. Left breast exhibits mass and tenderness. Left breast exhibits no inverted nipple, no nipple discharge and no skin change. Breasts are symmetrical.         Assessment:     42 year old hispanic patient presents for [facility] clinic visit.  Jaqui Laukaitis interpreted exam.  Patient has a history of benign bilateral breast biopsies.  She was followed by Dr. Jamal Collin.  Unable to obtain notes, or pathology.  Patient screened, and meets [facility] eligibility.  Patient does not have insurance, Medicare or Medicaid.  Handout given on Affordable Care Act.Instructed patient on breast self awareness using teach back method.  Patient is complaining of bilateral  breast pain, and lump, primarily on right.  Palpated bilateral thickening upper outer quadrants, more prominent on right.  Complains of tenderness on palpation.    Plan:     Jeanella Anton will  schedule patient for bilateral diagnostic mammogram, and ultrasound.  Orders are in.

## 2017-06-19 ENCOUNTER — Ambulatory Visit
Admission: RE | Admit: 2017-06-19 | Discharge: 2017-06-19 | Disposition: A | Discharge status: Home / Self Care | Payer: Self-pay | Source: Ambulatory Visit | Attending: Oncology | Admitting: Oncology

## 2017-06-19 ENCOUNTER — Encounter: Payer: Self-pay | Admitting: Radiology

## 2017-06-19 DIAGNOSIS — N63 Unspecified lump in unspecified breast: Secondary | ICD-10-CM

## 2017-06-19 DIAGNOSIS — N6313 Unspecified lump in the right breast, lower outer quadrant: Secondary | ICD-10-CM | POA: Insufficient documentation

## 2017-06-19 DIAGNOSIS — N6311 Unspecified lump in the right breast, upper outer quadrant: Secondary | ICD-10-CM | POA: Insufficient documentation

## 2017-06-19 IMAGING — MG MM DIGITAL DIAGNOSTIC BILAT W/ TOMO W/ CAD
8 of 20 series · 8 of 40 positions shown · non-contrast
Comparison: Previous exam(s).

CLINICAL DATA: Bilateral upper outer quadrants areas of palpable
concern felt by the patient. History of benign excisional biopsies
bilaterally. History of core needle biopsy of right breast mass.

EXAM:
DIGITAL DIAGNOSTIC BILATERAL MAMMOGRAM WITH CAD AND TOMO
ULTRASOUND BILATERAL BREAST

[L TAN]
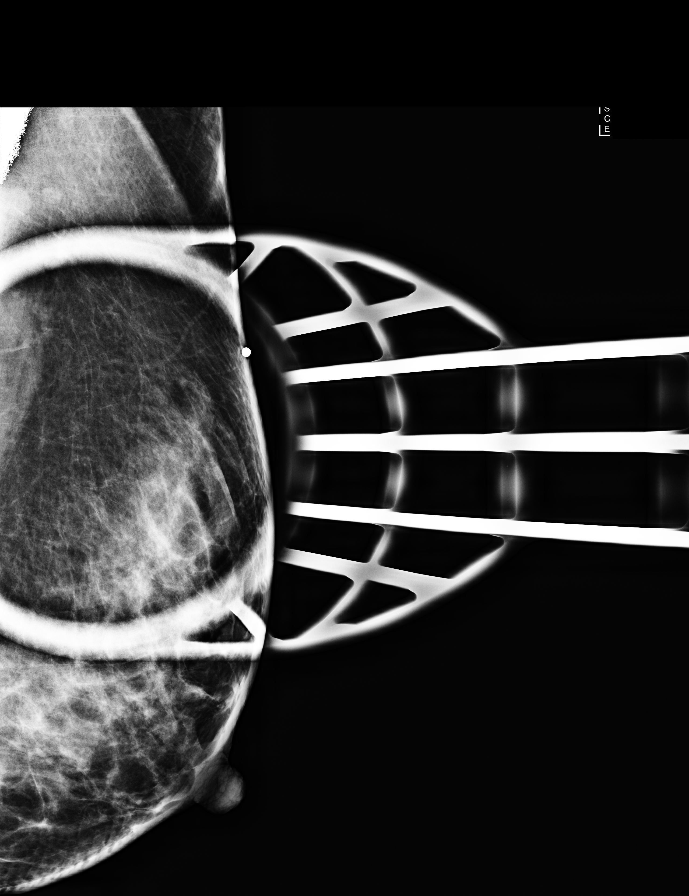

[L TAN synth-2D]
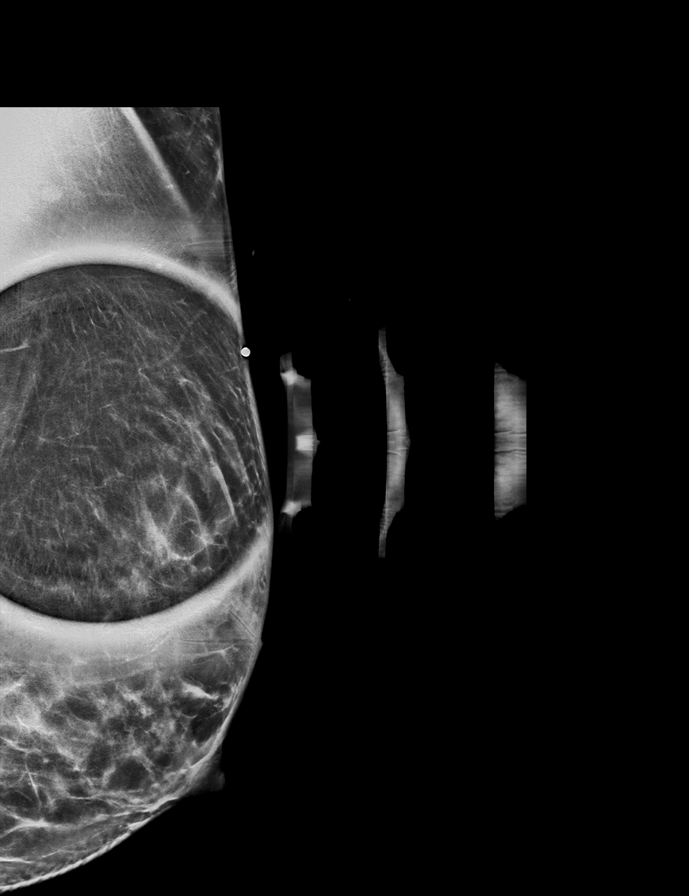

[L MLO synth-2D]
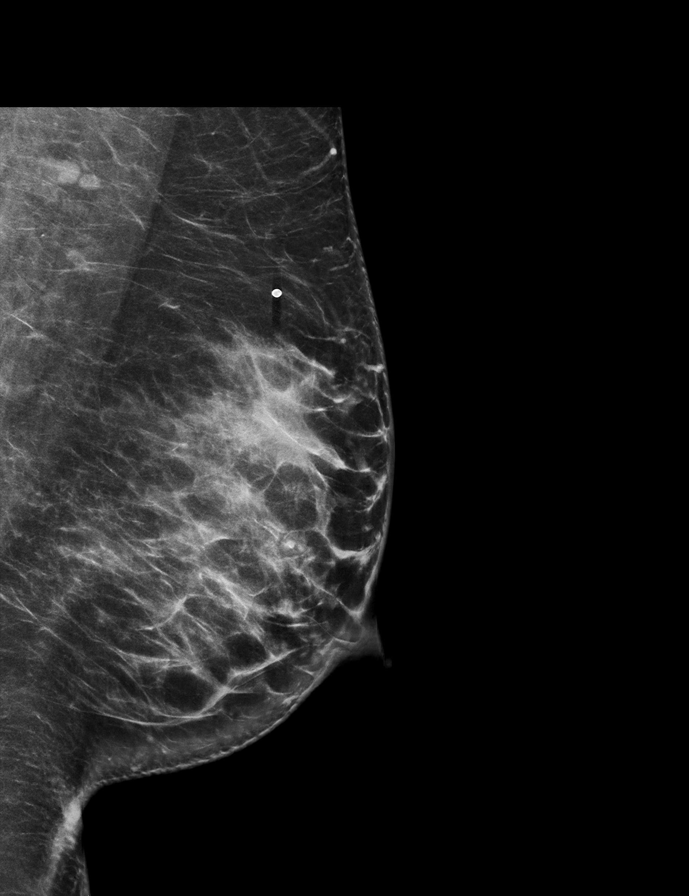

[L CC]
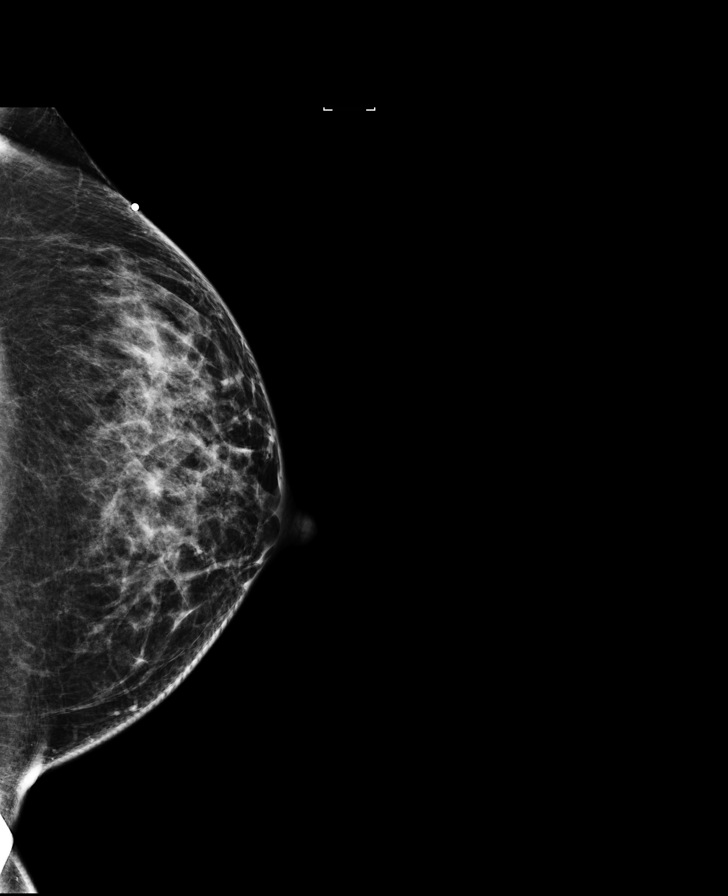

[R CC synth-2D]
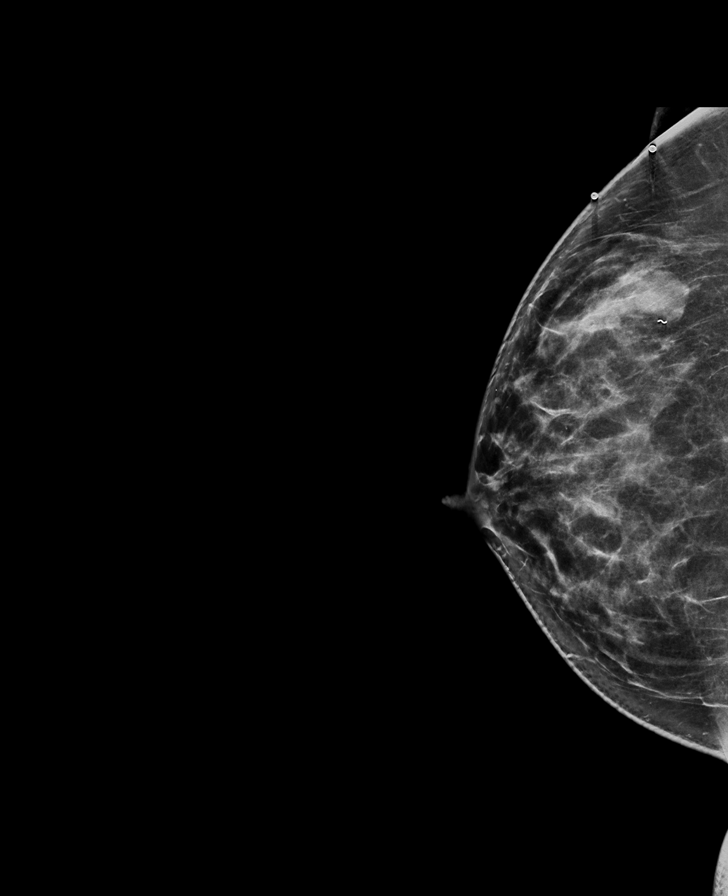

[R TAN synth-2D]
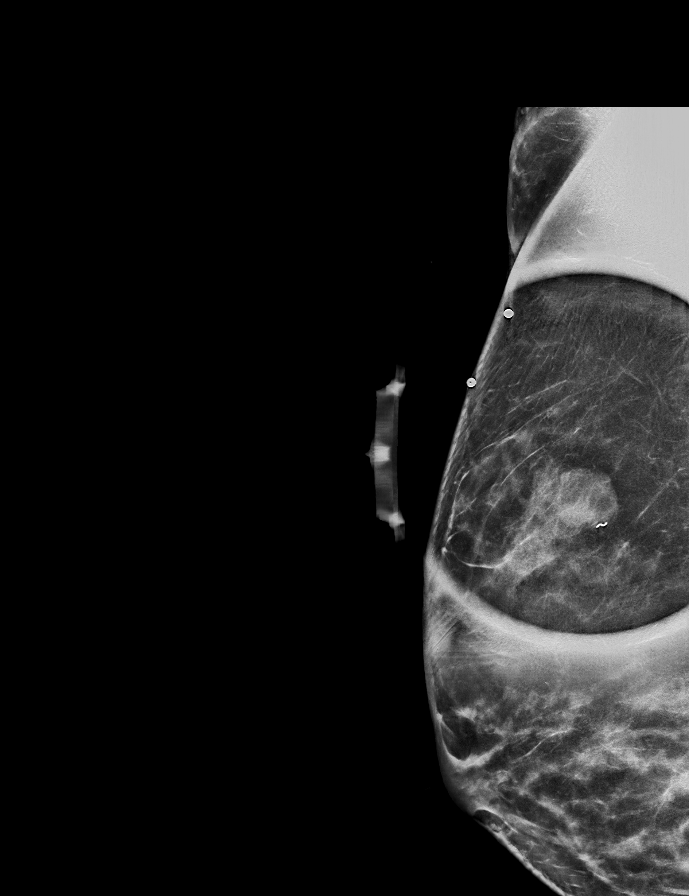

[R CC]
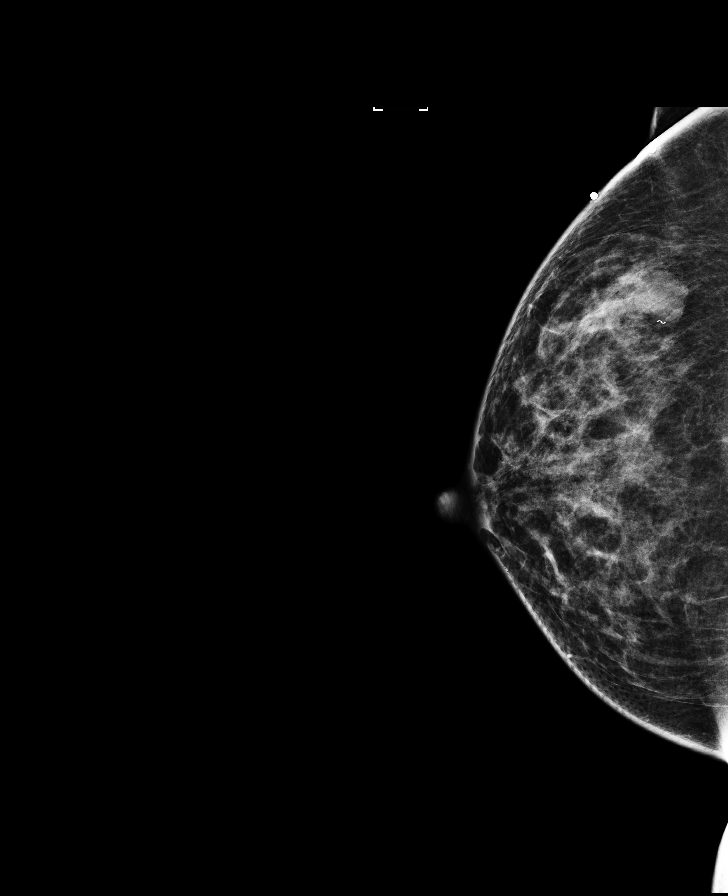

[R MLO]
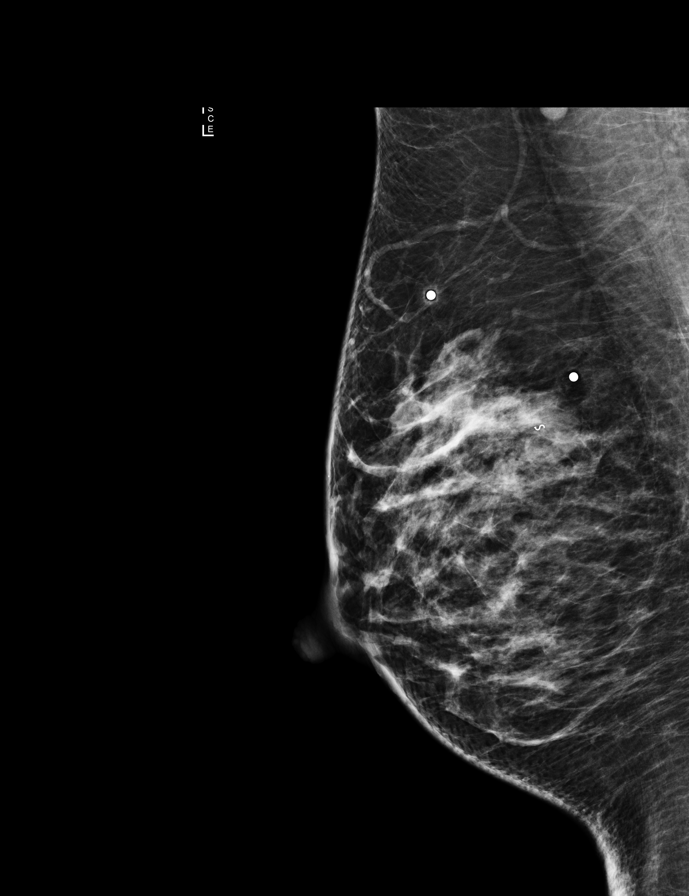

[8 of 40 positions shown; findings below may reference images not displayed]

ACR Breast Density Category c: The breast tissue is heterogeneously
dense, which may obscure small masses.
FINDINGS: Mammographically, there is a lobulated isodense to breast parenchyma
mass in the right breast upper outer quadrant, posterior depth,
which corresponds to 1 of the palpable areas described by the
patient.

Mammographically, there are no suspicious masses, areas of
nonsurgical architectural distortion or microcalcifications in the
left breast. There are bilateral postsurgical changes.

Mammographic images were processed with CAD.

On physical exam, there is a moderately firm palpable rounded mass
in the right breast upper outer quadrant. No other focal
abnormalities are palpated.

Targeted left breast ultrasound is performed, showing no suspicious
masses or shadowing lesions in the left breast.

Targeted right breast ultrasound is performed, showing 9 o'clock 6
cm from the nipple hypoechoic circumscribed lobulated mass measuring
1.6 x 0.8 x 1.3 cm. This mass corresponds to the mammographically
seen previously biopsied mass, however it demonstrates slight
increase in size from [AR] when it measured 1.2 x 1.3 x 0.7 cm. No
evidence of right axillary lymphadenopathy.
IMPRESSION: Right breast probably benign mass which however demonstrates mild
increase in size, because of which ultrasound-guided core needle
biopsy is recommended.

No mammographic or sonographic evidence of malignancy in the left
breast.

RECOMMENDATION:
Ultrasound-guided core needle biopsy of the right breast.

Otherwise, the further management of the bilateral breasts areas of
palpable concern should be based on clinical grounds.

I have discussed the findings and recommendations with the patient.
Results were also provided in writing at the conclusion of the
visit. If applicable, a reminder letter will be sent to the patient
regarding the next appointment.

BI-RADS CATEGORY  3: Probably benign.

## 2017-06-22 ENCOUNTER — Other Ambulatory Visit: Payer: Self-pay

## 2017-06-22 DIAGNOSIS — N63 Unspecified lump in unspecified breast: Secondary | ICD-10-CM

## 2017-07-07 ENCOUNTER — Ambulatory Visit
Admission: RE | Admit: 2017-07-07 | Discharge: 2017-07-07 | Disposition: A | Discharge status: Home / Self Care | Payer: Self-pay | Source: Ambulatory Visit | Attending: Oncology | Admitting: Oncology

## 2017-07-07 DIAGNOSIS — N63 Unspecified lump in unspecified breast: Secondary | ICD-10-CM

## 2017-07-07 HISTORY — PX: BREAST BIOPSY: SHX20

## 2017-07-07 IMAGING — MG MM DIGITAL DIAGNOSTIC UNILAT*R*
2 series · 2 of 2 positions shown · non-contrast
Comparison: Previous exam(s).

CLINICAL DATA: Post ultrasound-guided core needle biopsy of right
breast mass.

EXAM:
DIAGNOSTIC RIGHT MAMMOGRAM POST ULTRASOUND BIOPSY

[R ML]
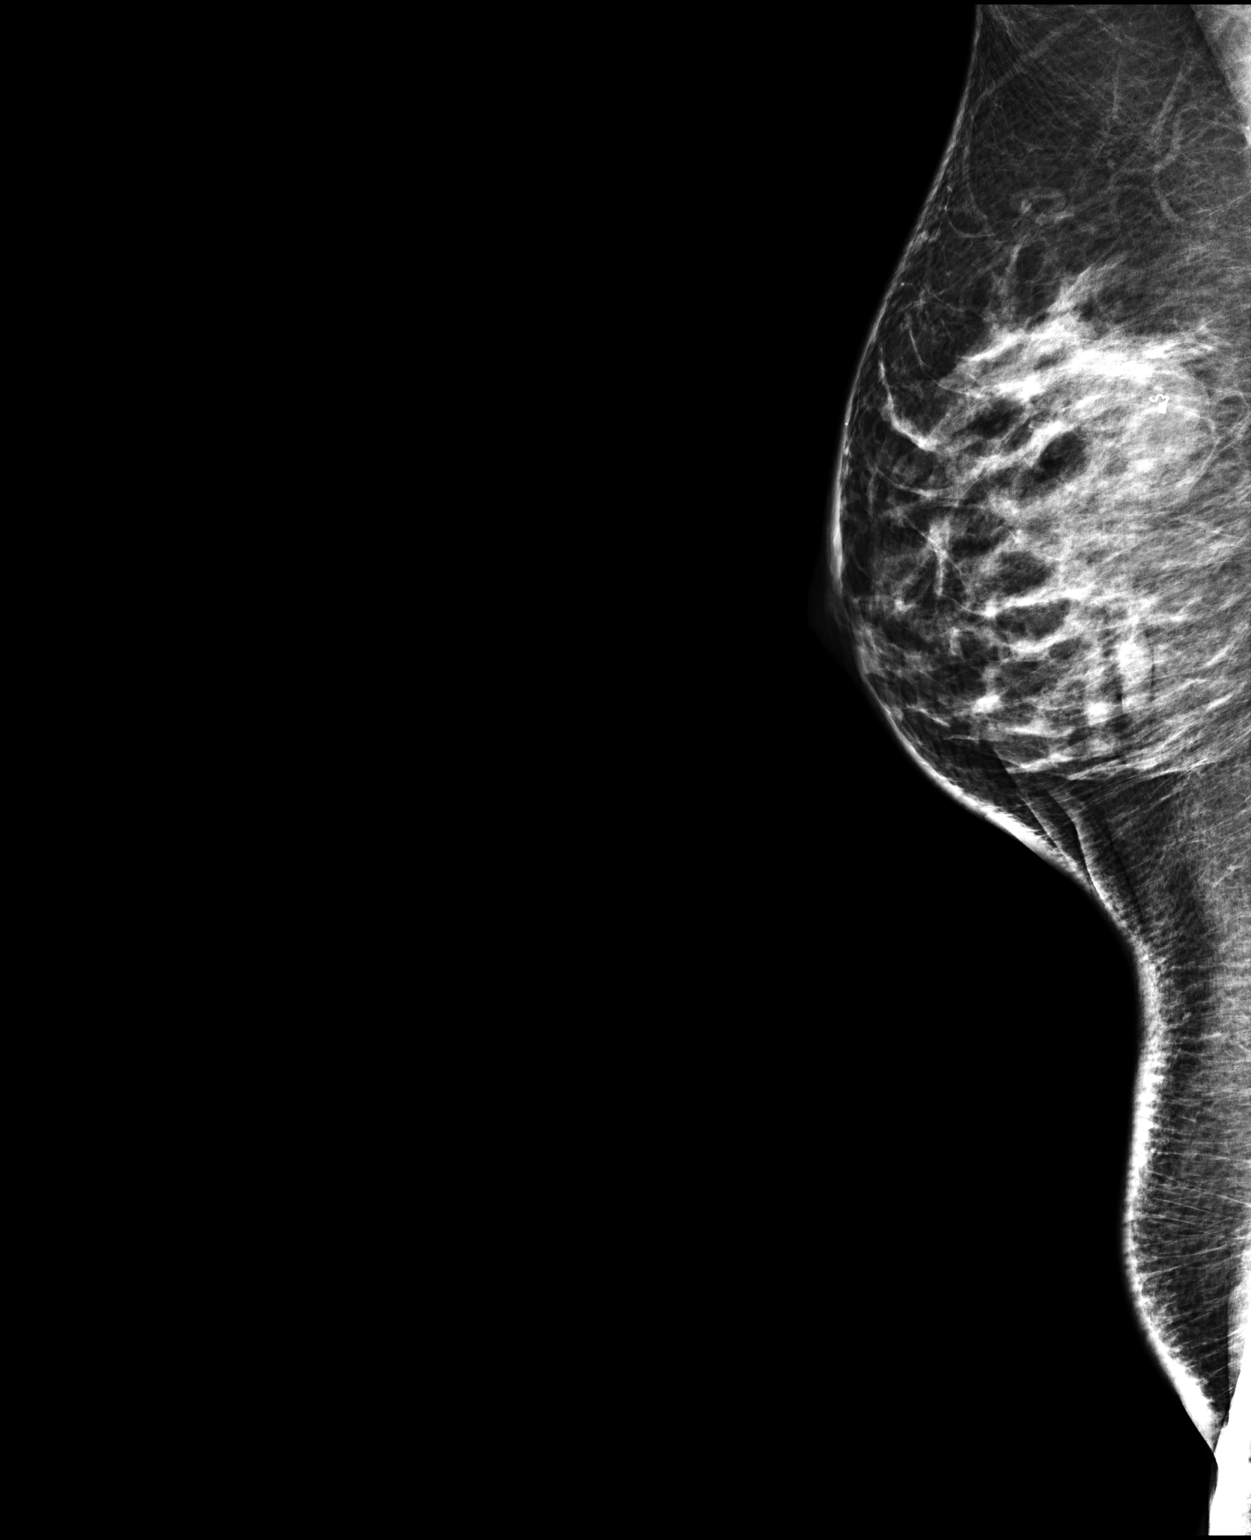

[R CC]
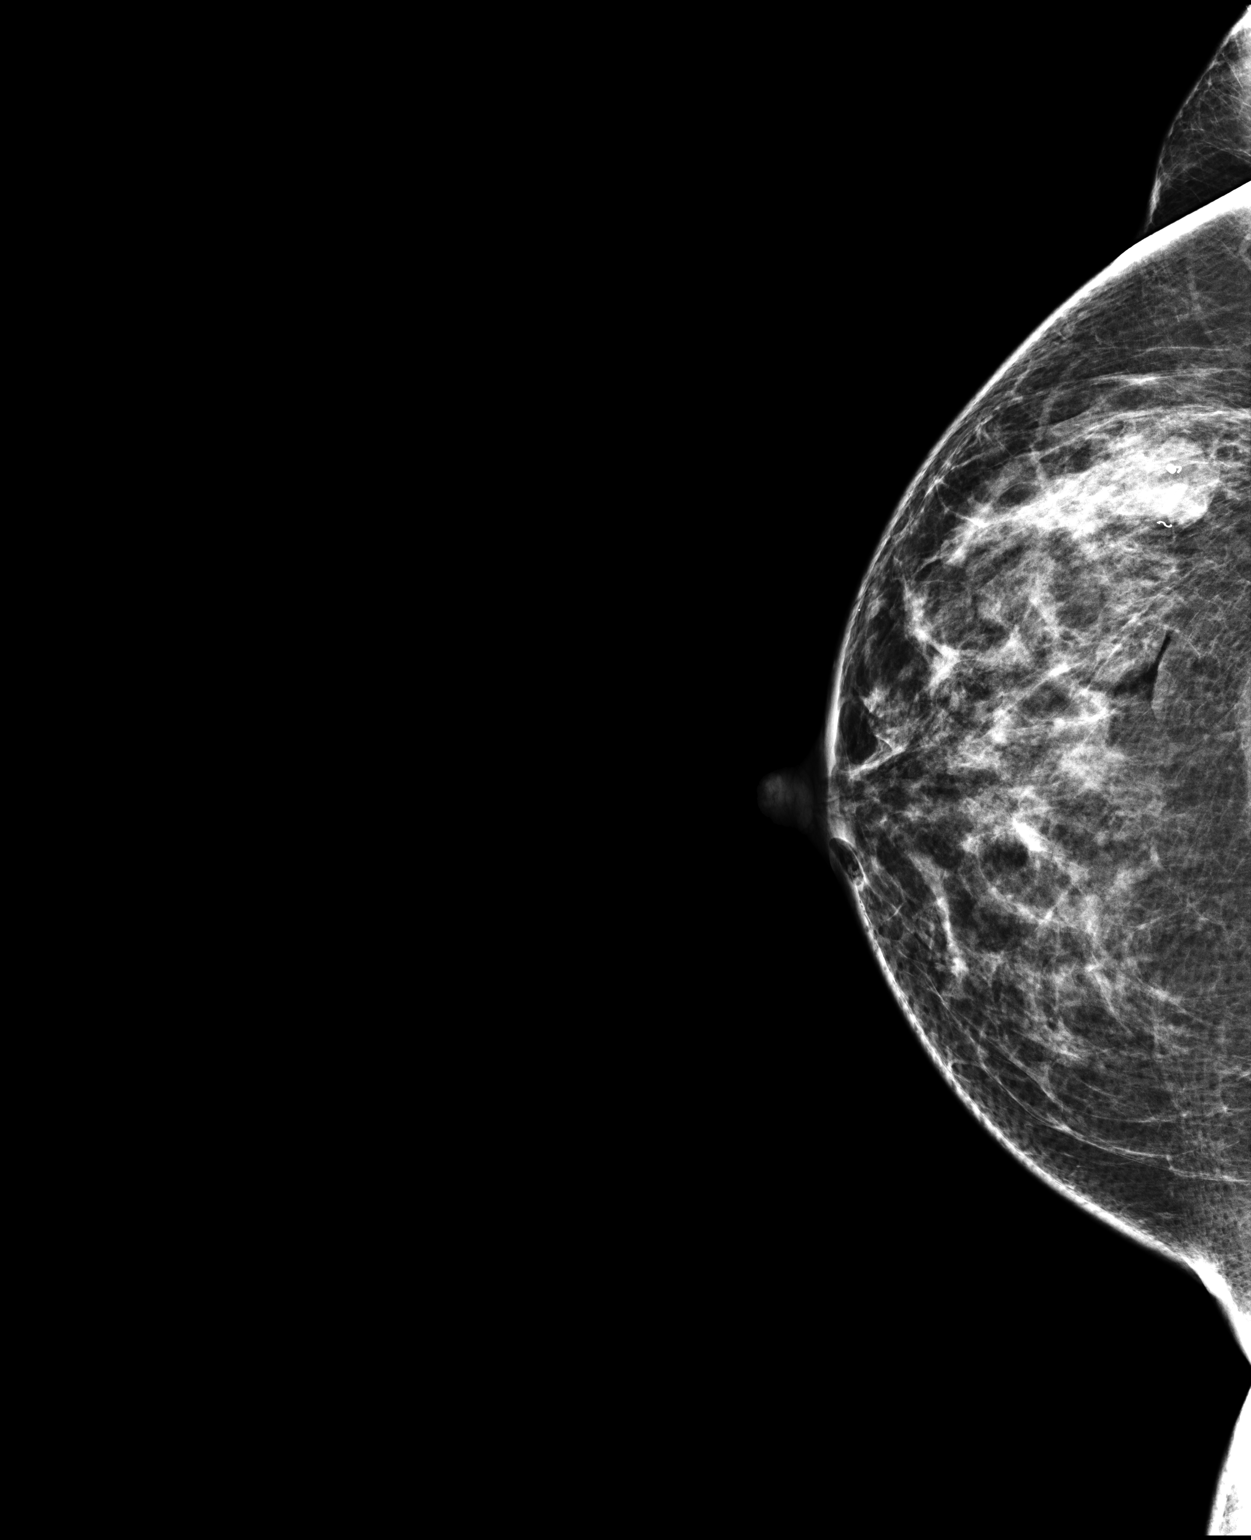

[2 of 2 positions shown; findings below may reference images not displayed]

FINDINGS: Mammographic images were obtained following ultrasound guided biopsy
of right breast 9 o'clock mass. Two-view mammography demonstrates
presence of coil shaped marker within the biopsied mass in the right
9 o'clock breast. Expected post biopsy changes are seen.
IMPRESSION: Successful placement of coil shaped marker, post ultrasound-guided
core needle biopsy of right breast 9 o'clock mass.

Final Assessment: Post Procedure Mammograms for Marker Placement

## 2017-07-08 LAB — SURGICAL PATHOLOGY

## 2017-07-17 NOTE — Progress Notes (Signed)
Radiologist discussed benign biopsy results with patient, and recommendation of continuing annual mammograms.  Copy to [facility].

## 2019-08-05 ENCOUNTER — Ambulatory Visit: Payer: Self-pay | Attending: Internal Medicine

## 2020-01-05 ENCOUNTER — Emergency Department: Payer: Self-pay

## 2020-01-05 ENCOUNTER — Emergency Department
Admission: EM | Admit: 2020-01-05 | Discharge: 2020-01-05 | Disposition: A | Discharge status: Home / Self Care | Payer: Self-pay | Attending: Emergency Medicine | Admitting: Emergency Medicine

## 2020-01-05 ENCOUNTER — Encounter: Payer: Self-pay | Admitting: Emergency Medicine

## 2020-01-05 ENCOUNTER — Other Ambulatory Visit: Payer: Self-pay

## 2020-01-05 DIAGNOSIS — J45909 Unspecified asthma, uncomplicated: Secondary | ICD-10-CM | POA: Insufficient documentation

## 2020-01-05 DIAGNOSIS — R202 Paresthesia of skin: Secondary | ICD-10-CM

## 2020-01-05 DIAGNOSIS — E119 Type 2 diabetes mellitus without complications: Secondary | ICD-10-CM | POA: Insufficient documentation

## 2020-01-05 DIAGNOSIS — R200 Anesthesia of skin: Secondary | ICD-10-CM

## 2020-01-05 HISTORY — DX: Type 2 diabetes mellitus without complications: E11.9

## 2020-01-05 LAB — CBC
HCT: 38.8 % (ref 36.0–46.0)
Hemoglobin: 13.8 g/dL (ref 12.0–15.0)
MCH: 29.8 pg (ref 26.0–34.0)
MCHC: 35.6 g/dL (ref 30.0–36.0)
MCV: 83.8 fL (ref 80.0–100.0)
Platelets: 354 10*3/uL (ref 150–400)
RBC: 4.63 MIL/uL (ref 3.87–5.11)
RDW: 13.7 % (ref 11.5–15.5)
WBC: 8.4 10*3/uL (ref 4.0–10.5)
nRBC: 0 % (ref 0.0–0.2)

## 2020-01-05 LAB — COMPREHENSIVE METABOLIC PANEL
ALT: 19 U/L (ref 0–44)
AST: 20 U/L (ref 15–41)
Albumin: 4.5 g/dL (ref 3.5–5.0)
Alkaline Phosphatase: 61 U/L (ref 38–126)
Anion gap: 12 (ref 5–15)
BUN: 10 mg/dL (ref 6–20)
CO2: 23 mmol/L (ref 22–32)
Calcium: 9.5 mg/dL (ref 8.9–10.3)
Chloride: 102 mmol/L (ref 98–111)
Creatinine, Ser: 0.49 mg/dL (ref 0.44–1.00)
GFR calc Af Amer: >60 mL/min (ref >60)
GFR calc non Af Amer: >60 mL/min (ref >60)
Glucose, Bld: 122 mg/dL — ABNORMAL HIGH (ref 70–99)
Potassium: 3.8 mmol/L (ref 3.5–5.1)
Sodium: 137 mmol/L (ref 135–145)
Total Bilirubin: 0.7 mg/dL (ref 0.3–1.2)
Total Protein: 7.8 g/dL (ref 6.5–8.1)

## 2020-01-05 LAB — DIFFERENTIAL
Abs Immature Granulocytes: 0.02 10*3/uL (ref 0.00–0.07)
Basophils Absolute: 0.1 10*3/uL (ref 0.0–0.1)
Basophils Relative: 1 %
Eosinophils Absolute: 0.2 10*3/uL (ref 0.0–0.5)
Eosinophils Relative: 3 %
Immature Granulocytes: 0 %
Lymphocytes Relative: 26 %
Lymphs Abs: 2.2 10*3/uL (ref 0.7–4.0)
Monocytes Absolute: 0.6 10*3/uL (ref 0.1–1.0)
Monocytes Relative: 7 %
Neutro Abs: 5.3 10*3/uL (ref 1.7–7.7)
Neutrophils Relative %: 63 %

## 2020-01-05 LAB — APTT: aPTT: 32 seconds (ref 24–36)

## 2020-01-05 LAB — PROTIME-INR
INR: 0.9 (ref 0.8–1.2)
Prothrombin Time: 11.8 seconds (ref 11.4–15.2)

## 2020-01-05 IMAGING — MR MR HEAD W/O CM
11 series · 44 of 48 positions shown · non-contrast
Comparison: Head CT [DATE]

CLINICAL DATA: Left-sided numbness.

EXAM:
MRI HEAD WITHOUT CONTRAST
TECHNIQUE: Multiplanar, multiecho pulse sequences of the brain and surrounding
structures were obtained without intravenous contrast.

[Series 5: ax dwi_tracew · axial · 3.0mm · 0.60mm/px · z∈[-92,+62]mm · 4 of 48 slices shown]
[im 1/48]
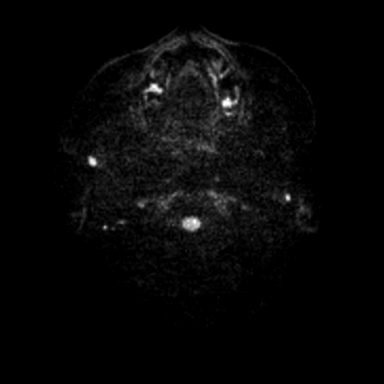
[im 16/48]
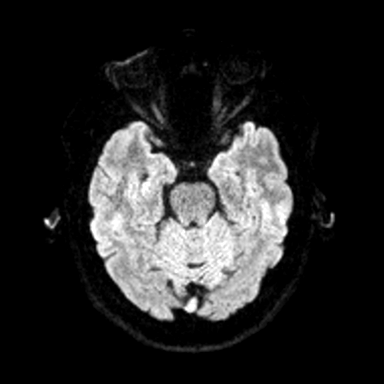
[im 32/48]
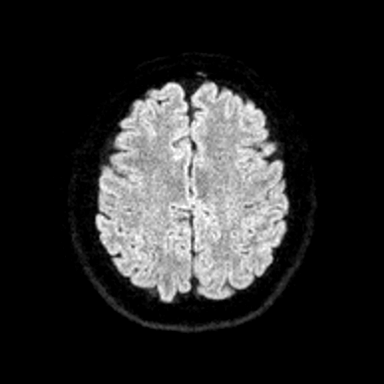
[im 48/48]
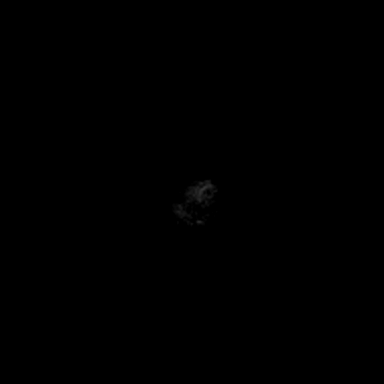

[Series 6: ax dwi_adc · axial · 3.0mm · 0.60mm/px · z∈[-92,+62]mm · 4 of 48 slices shown]
[im 1/48]
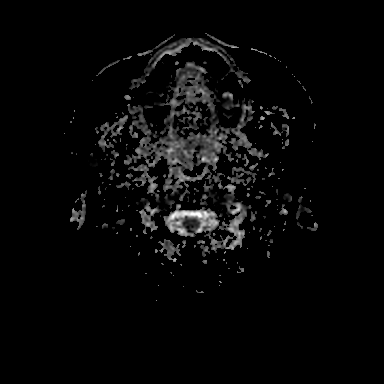
[im 16/48]
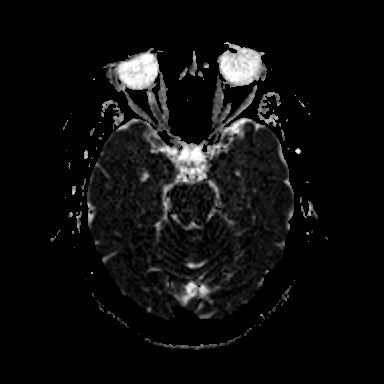
[im 32/48]
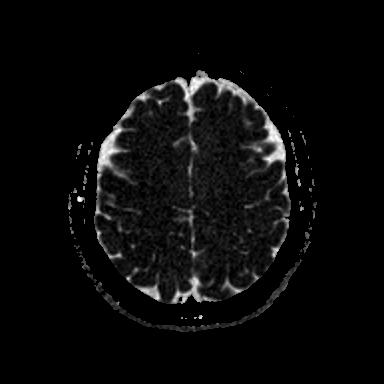
[im 48/48]
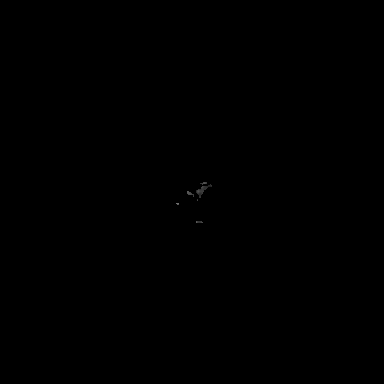

[Series 7: cor dwi_tracew · coronal · 5.0mm · 0.60mm/px · 3 of 40 slices shown]
[im 1/40]
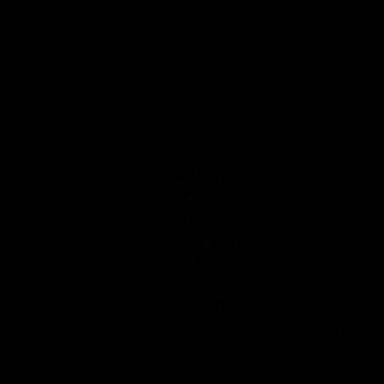
[im 20/40]
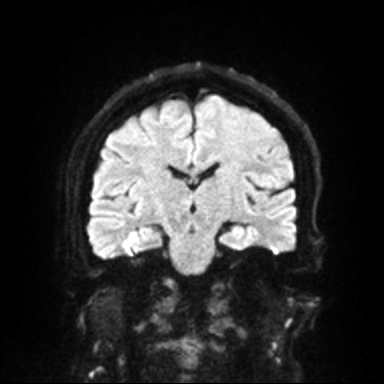
[im 40/40]
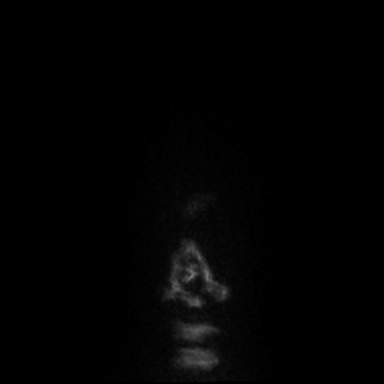

[Series 8: cor dwi_adc · coronal · 5.0mm · 0.60mm/px · 3 of 39 slices shown]
[im 1/39]
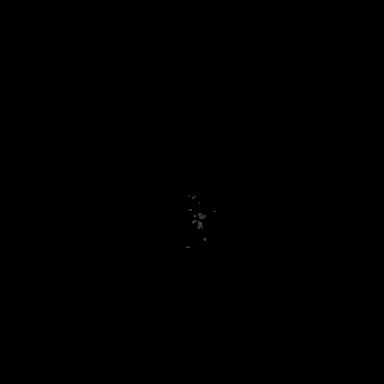
[im 20/39]
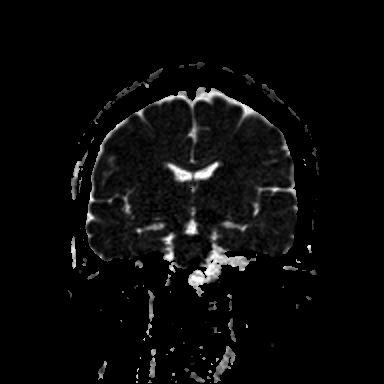
[im 39/39]
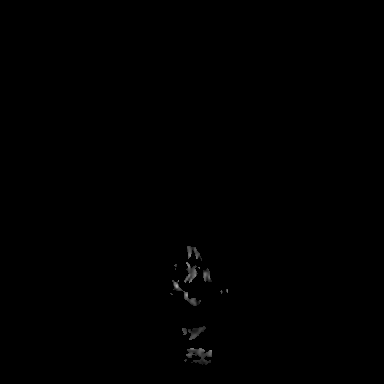

[Series 9: T1 · sagittal · 5.0mm · 0.62mm/px · 2 of 24 slices shown (1 of 2)]
[im 1/24]
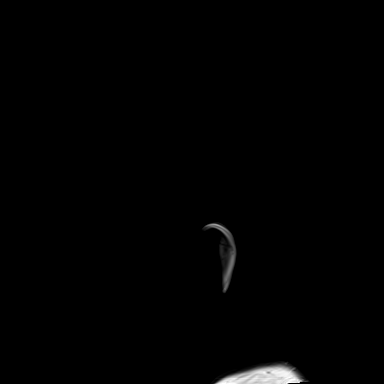
[im 24/24]
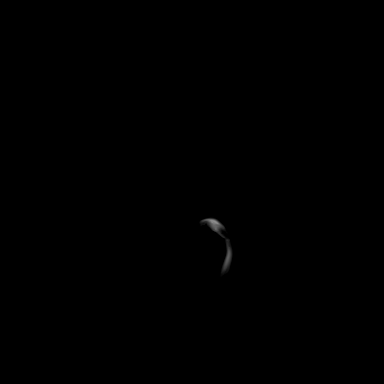

[Series 10: T2 · axial · 5.0mm · 0.53mm/px · z∈[-87,+57]mm · 2 of 25 slices shown (1 of 2)]
[im 1/25]
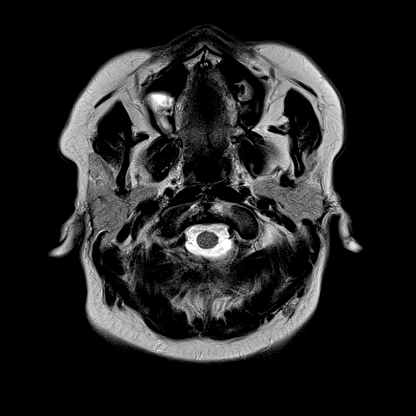
[im 25/25]
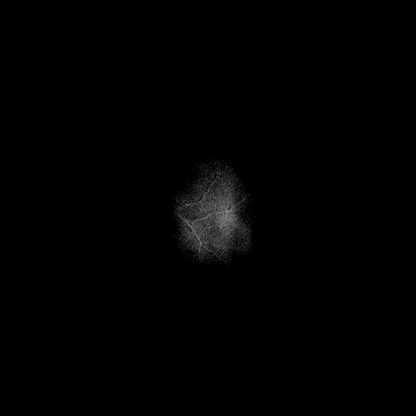

[Series 11: FLAIR · axial · 3.0mm · 0.53mm/px · z∈[-96,+66]mm · 4 of 55 slices shown]
[im 1/55]
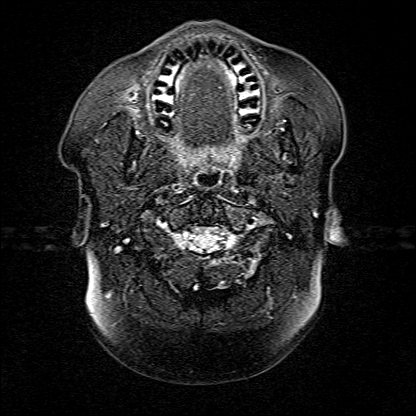
[im 19/55]
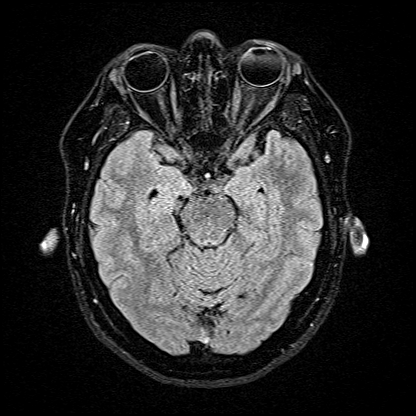
[im 37/55]
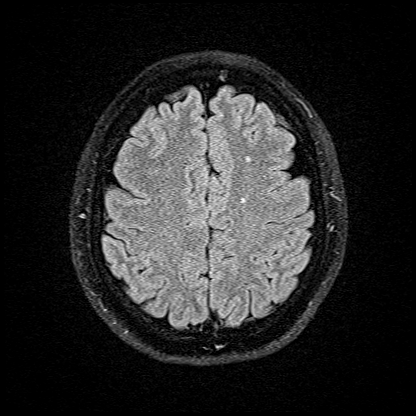
[im 55/55]
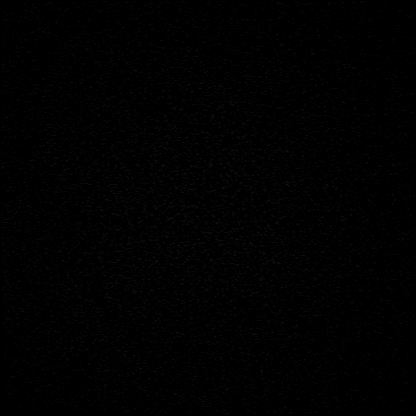

[Series 12: T1 · axial · 1.0mm · 0.98mm/px · z∈[-101,+73]mm · 10 of 176 slices shown (2 of 2)]
[im 1/176]
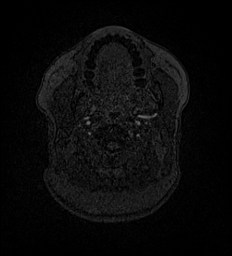
[im 14/176]
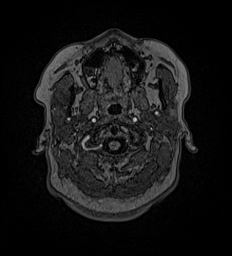
[im 27/176]
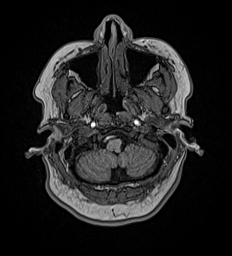
[im 41/176]
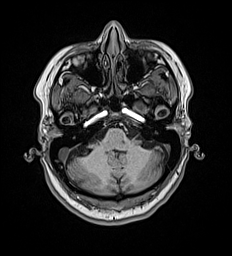
[im 54/176]
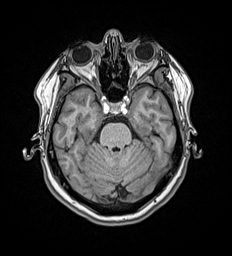
[im 81/176]
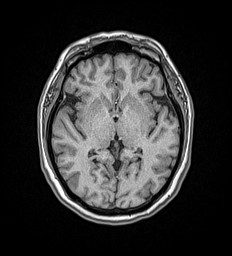
[im 95/176]
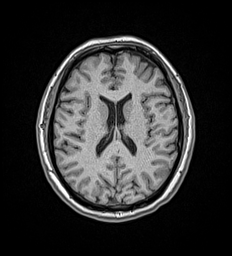
[im 122/176]
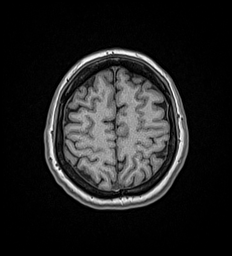
[im 149/176]
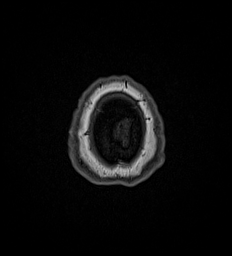
[im 176/176]
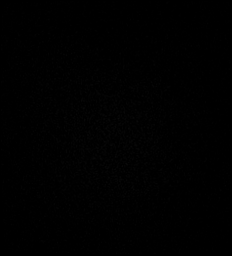

[Series 14: pha_images · axial · 3.0mm · 0.90mm/px · z∈[-102,+74]mm · 5 of 59 slices shown]
[im 1/59]
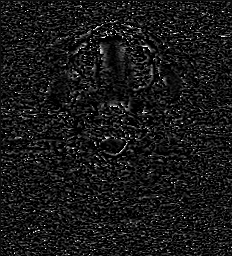
[im 15/59]
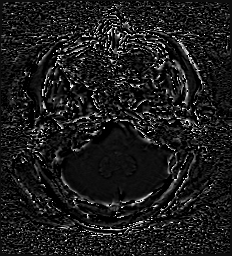
[im 30/59]
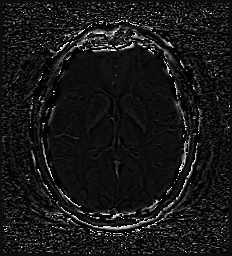
[im 44/59]
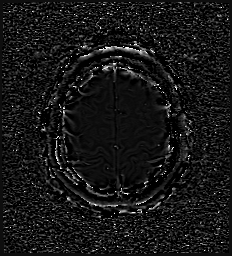
[im 59/59]
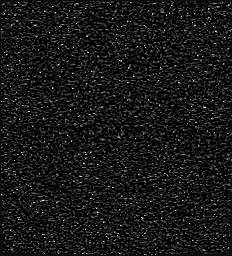

[Series 15: swi_images · axial · 3.0mm · 0.90mm/px · z∈[-102,+74]mm · 5 of 60 slices shown]
[im 1/60]
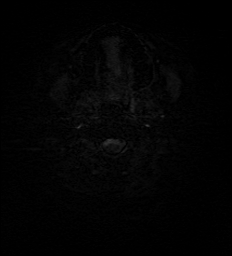
[im 15/60]
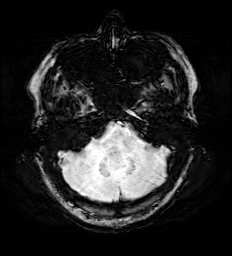
[im 30/60]
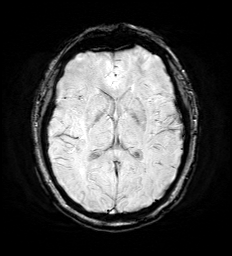
[im 45/60]
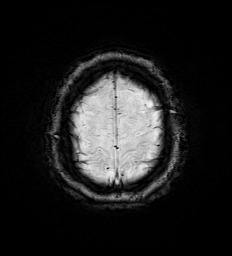
[im 60/60]
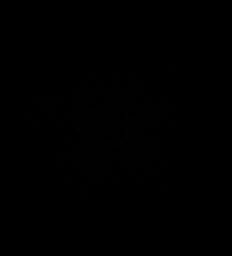

[Series 17: T2 · coronal · 5.0mm · 0.57mm/px · 2 of 29 slices shown (2 of 2)]
[im 1/29]
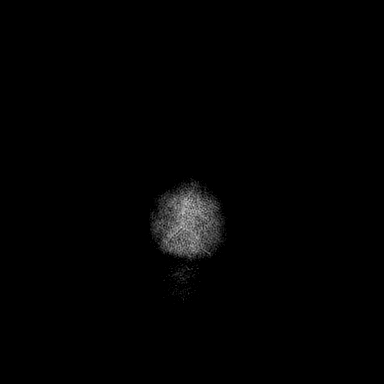
[im 29/29]
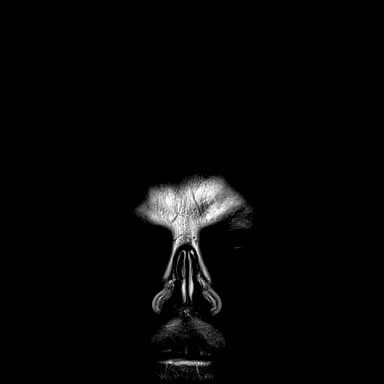

[44 of 48 positions shown; findings below may reference images not displayed]

FINDINGS: Brain: There is no evidence of an acute infarct, intracranial
hemorrhage, mass, midline shift, or extra-axial fluid collection.
The ventricles and sulci are normal. A few scattered small foci of
T2 hyperintensity in the cerebral white matter bilaterally are
nonspecific but favored to reflect early chronic small vessel
ischemic disease given the patient's history of diabetes.

Vascular: Major intracranial vascular flow voids are preserved.

Skull and upper cervical spine: No suspicious marrow lesion.
Diffusely diminished bone marrow T1 signal in the included upper
cervical spine, nonspecific though can be seen with anemia, smoking,
and obesity.

Sinuses/Orbits: Unremarkable orbits. Mild mucosal thickening and
small mucous retention cyst inferiorly in the right maxillary sinus.
Clear mastoid air cells.

Other: None.
IMPRESSION: 1. No acute intracranial abnormality.
2. Minimal chronic small vessel ischemic disease.

## 2020-01-05 IMAGING — CT CT HEAD W/O CM
3 series · 16 of 45 positions shown, 19 images · non-contrast
Comparison: None.

CLINICAL DATA: Left-sided numbness.

EXAM:
CT HEAD WITHOUT CONTRAST
TECHNIQUE: Contiguous axial images were obtained from the base of the skull
through the vertex without intravenous contrast.

[Series 2: head wo · axial · 0.39mm/px · z∈[-137,-22]mm · 10 of 28 slices shown, 13 images]
[im 3/28  brain]
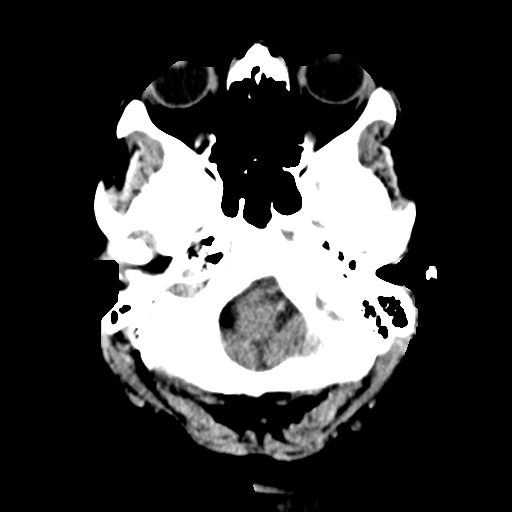
[im 3/28  bone]
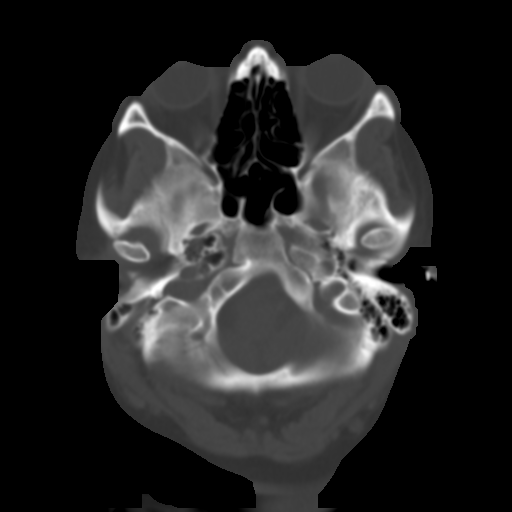
[im 5/28  brain]
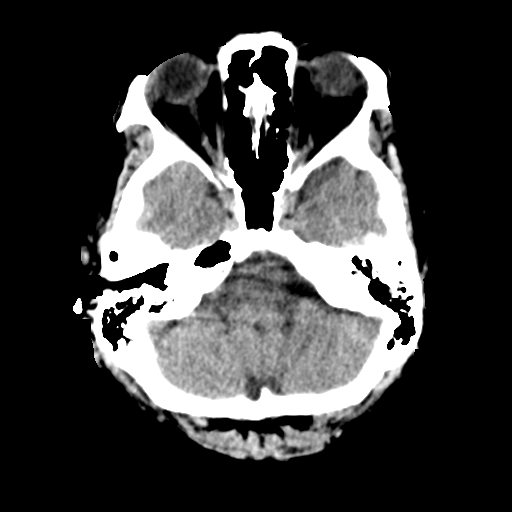
[im 8/28  brain]
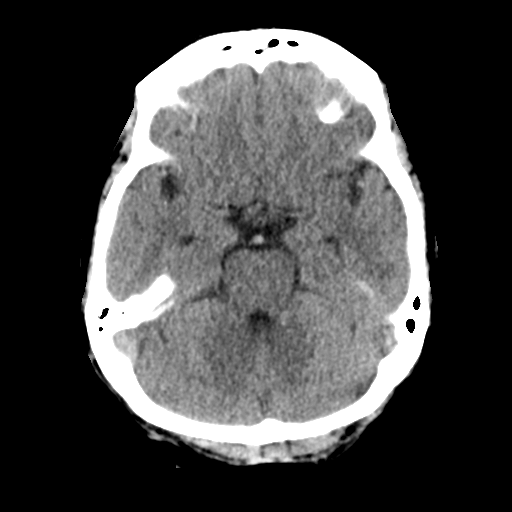
[im 11/28  brain]
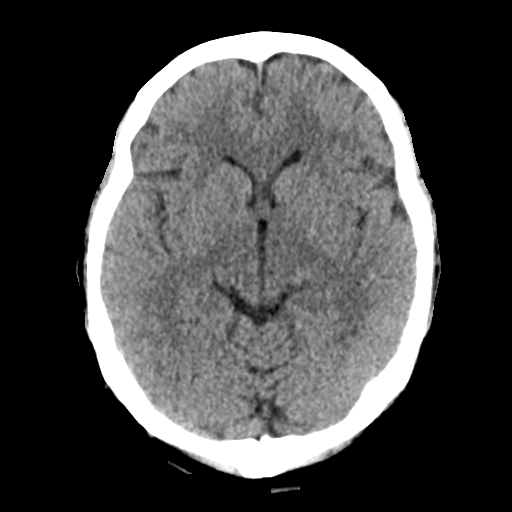
[im 13/28  brain]
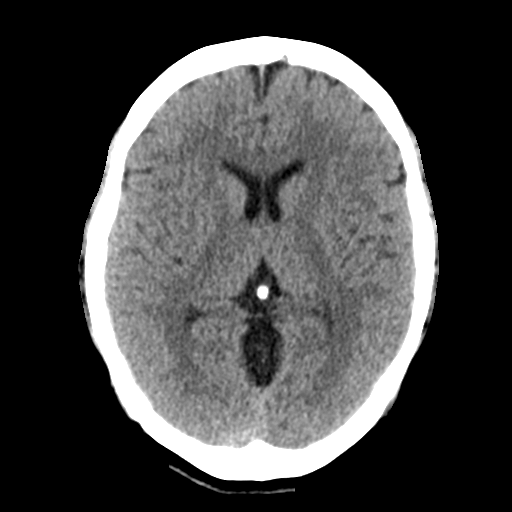
[im 13/28  bone]
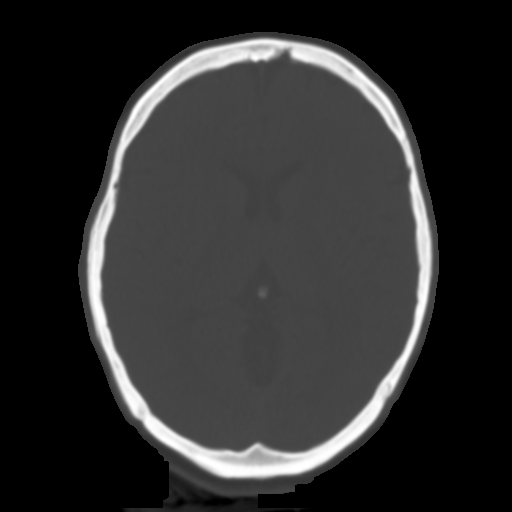
[im 16/28  brain]
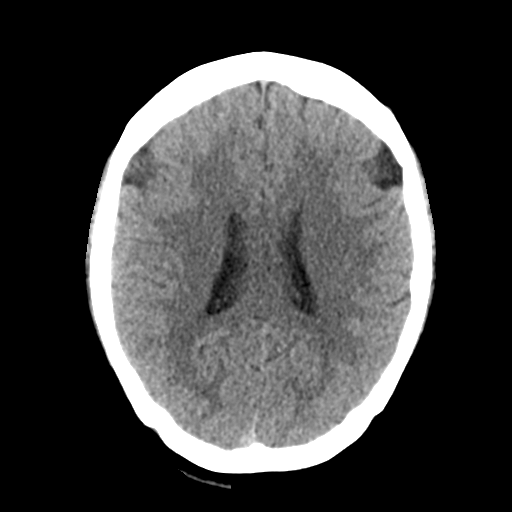
[im 18/28  brain]
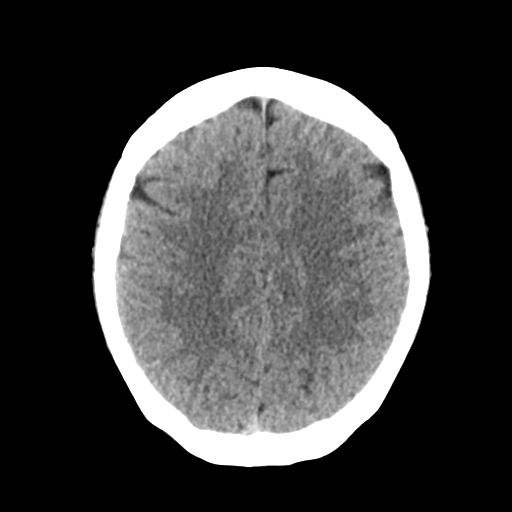
[im 21/28  brain]
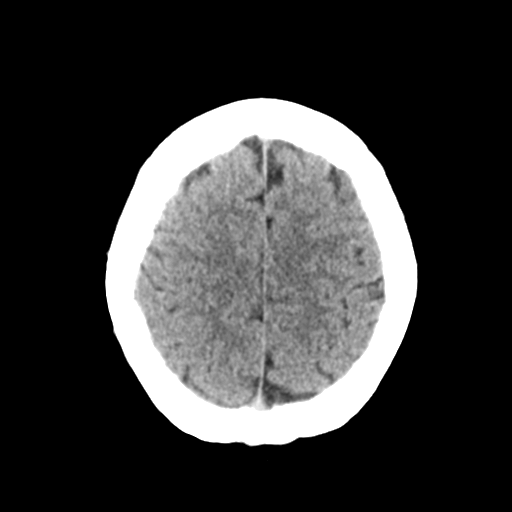
[im 24/28  brain]
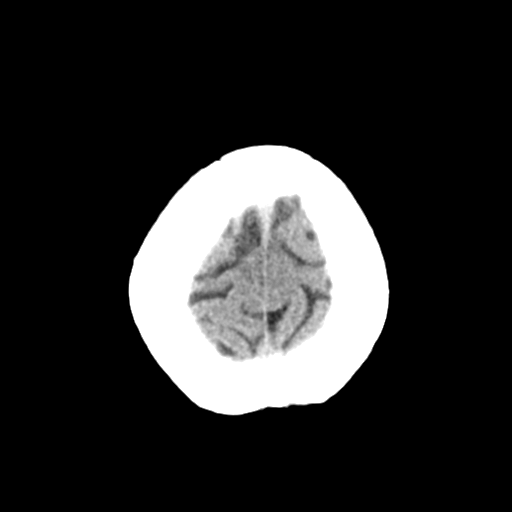
[im 24/28  bone]
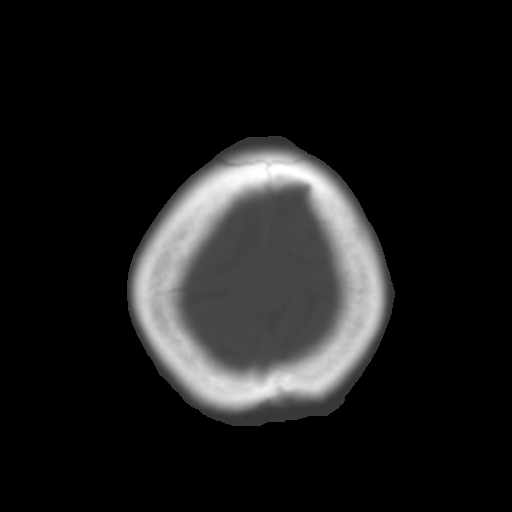
[im 26/28  brain]
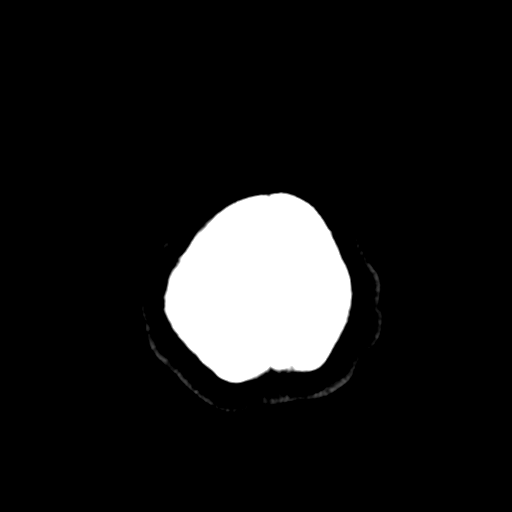

[Series 4: coronal soft tissue · coronal · 0.30mm/px · 3 of 64 slices shown]
[im 22/64  brain]
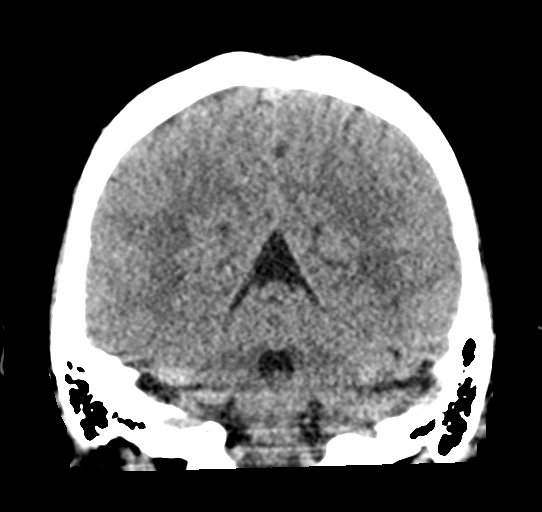
[im 29/64  brain]
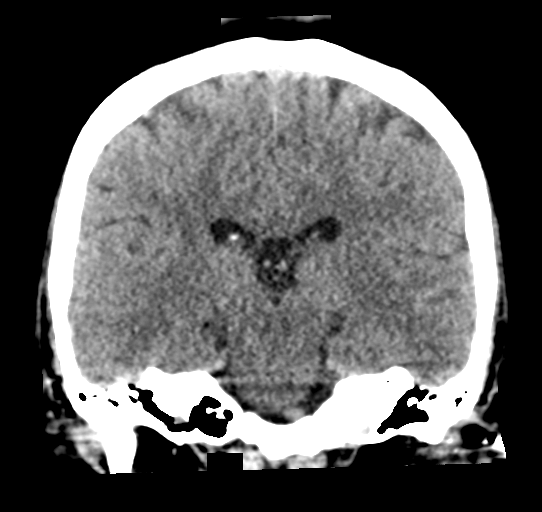
[im 36/64  brain]
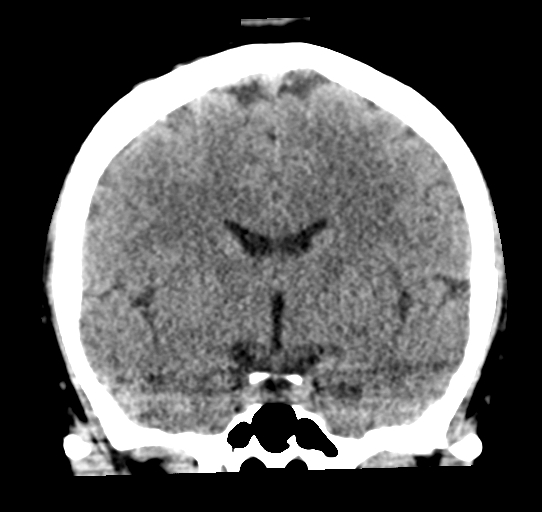

[Series 5: sagittal soft tissue · sagittal · 0.30mm/px · 3 of 52 slices shown]
[im 18/52  brain]
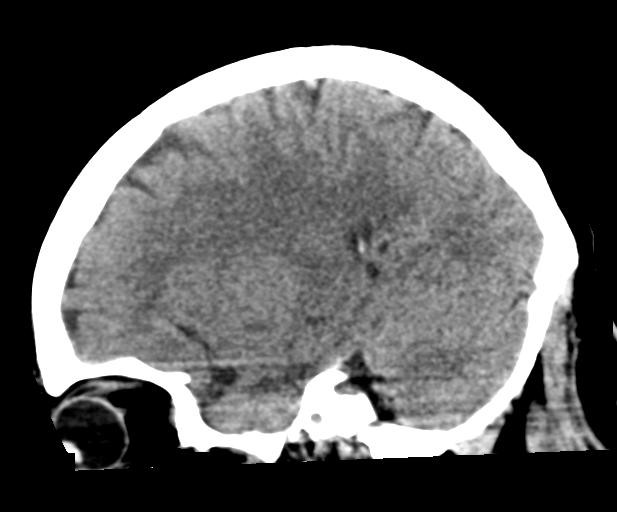
[im 26/52  brain]
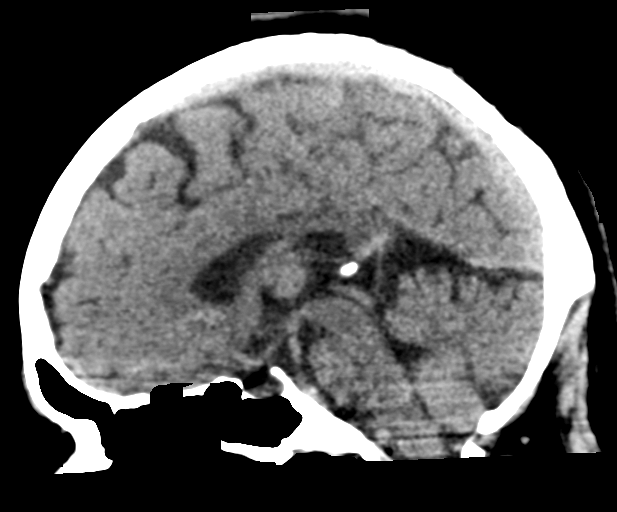
[im 35/52  brain]
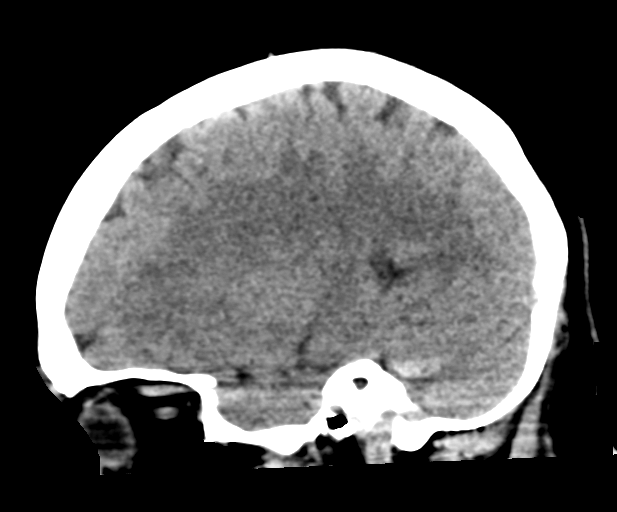

[16 of 45 positions shown; findings below may reference images not displayed]

FINDINGS: Brain: No evidence of acute infarction, hemorrhage, hydrocephalus,
extra-axial collection or mass lesion/mass effect.

Vascular: No hyperdense vessel or unexpected calcification.

Skull: Normal. Negative for fracture or focal lesion.

Sinuses/Orbits: No acute finding.

Other: None.
IMPRESSION: No acute intracranial pathology.

## 2020-01-05 MED ORDER — METHOCARBAMOL 500 MG PO TABS
1000.0000 mg | ORAL_TABLET | Freq: Once | ORAL | Status: AC
  Administered 2020-01-05: 22:00:00 1000 mg via ORAL
  Filled 2020-01-05: qty 2

## 2020-01-05 MED ORDER — SODIUM CHLORIDE 0.9% FLUSH: 3.0000 mL | Freq: Once | INTRAVENOUS | Status: DC

## 2020-01-05 MED ORDER — METHOCARBAMOL 500 MG PO TABS: 500.0000 mg | ORAL_TABLET | Freq: Four times a day (QID) | ORAL | 0 refills | Status: DC

## 2020-01-05 NOTE — ED Provider Notes (Signed)
[facility] Emergency Department Provider Note  Time seen: 4:07 PM  I have reviewed the triage vital signs and the nursing notes. Spanish interpreter used for this evaluation.  HISTORY  Chief Complaint Numbness   HPI Shelda Jayliana Valencia is a 44 y.o. female with a past medical history of diabetes, depression, presents to the emergency department for numbness sensation to the left arm leg and face.  According to the patient over the past day or 2 she has been experiencing numbness in the left leg and somewhat to the left arm.  States today she began feeling some sensation of numbness to the left face as well.  Patient denies any weakness.  Denies any trouble walking.  No history of stroke.  Denies any pain.   Patient does state her left leg feels somewhat heavy when walking.  Past Medical History:  Diagnosis Date  . Asthma 2010  . Depression   . Diabetes mellitus without complication ([place])   . Lump or mass in breast 2010    There are no problems to display for this patient.   Past Surgical History:  Procedure Laterality Date  . BREAST BIOPSY Right 2017   benign, s shape marker  . BREAST BIOPSY Right 07/07/2017   path pending, coil shape  . BREAST CYST EXCISION Bilateral 1999   neg  . BREAST SURGERY Bilateral 2000   bilateral-cyst removed     Prior to Admission medications   Not on File    Allergies  Allergen Reactions  . Naproxen Shortness Of Breath, Swelling, Rash and Anaphylaxis  . Penicillins Hives    BREATHING PROBLEMS   . Amoxicillin     BREATHING PROBLEMS   . Augmentin [Amoxicillin-Pot Clavulanate]     BREATHING PROBLEMS   . Ibuprofen     BREASTING PROBLEMS   . Nsaids Swelling  . Aspirin Rash    No family history on file.  Social History Social History   Tobacco Use  . Smoking status: Never Smoker  . Smokeless tobacco: Never Used  Substance Use Topics  . Alcohol use: No  . Drug use: No    Review of  Systems Constitutional: Negative for fever. Cardiovascular: Negative for chest pain. Respiratory: Negative for shortness of breath. Gastrointestinal: Negative for abdominal pain Musculoskeletal: Negative for musculoskeletal complaints Neurological: Negative for headache.  Left face arm and leg numbness. All other ROS negative  ____________________________________________   PHYSICAL EXAM:  VITAL SIGNS: ED Triage Vitals [01/05/20 1414]  Enc Vitals Group     BP 127/72     Pulse Rate 99     Resp 18     Temp 98.5 F (36.9 C)     Temp Source Oral     SpO2 97 %     Weight 180 lb (81.6 kg)     Height 5\' 4"  (1.626 m)     Head Circumference      Peak Flow      Pain Score 0     Pain Loc      Pain Edu?      Excl. in [place]?    Constitutional: Alert. Well appearing and in no distress. Eyes: Normal exam ENT      Head: Normocephalic and atraumatic.      Mouth/Throat: Mucous membranes are moist. Cardiovascular: Normal rate, regular rhythm. No murmur Respiratory: Normal respiratory effort without tachypnea nor retractions. Breath sounds are clear  Gastrointestinal: Soft and nontender. No distention. Musculoskeletal: Nontender with normal range of motion in all  extremities.  Neurologic:  Normal speech and language. No gross focal neurologic deficits  Skin:  Skin is warm, dry and intact.  Psychiatric: Mood and affect are normal.   ____________________________________________    EKG  EKG viewed and interpreted by myself shows a normal sinus rhythm at 94 bpm with a narrow QRS, normal axis, normal intervals, no concerning ST changes.  Reassuring EKG.  ____________________________________________    RADIOLOGY  CT head negative  ____________________________________________   INITIAL IMPRESSION / ASSESSMENT AND PLAN / ED COURSE  Pertinent labs & imaging results that were available during my care of the patient were reviewed by me and considered in my medical decision making (see  chart for details).   Patient presents to the emergency department with left-sided numbness over the past several days progressing per patient.  Overall the patient appears well, neurologically testing is intact besides subjective sensation.  Patient's work-up is overall reassuring including lab work, EKG and CT scan of the head.  Overall the patient appears well but does state subjective decrease in sensation on left side arm leg and face.  Given these findings we will proceed with MR imaging of the brain as precaution.  Patient denies any neck or back pain.  Patient awaiting MRI.  I discussed the patient with MRI they state they have had multiple patients requiring MRIs today and they expect an ETA of 8:30 PM for her MRI.  Patient is willing to wait.  Patient care signed out.  [place] Doneisha Ivey was evaluated in Emergency Department on 01/05/2020 for the symptoms described in the history of present illness. She was evaluated in the context of the global COVID-19 pandemic, which necessitated consideration that the patient might be at risk for infection with the SARS-CoV-2 virus that causes COVID-19. Institutional protocols and algorithms that pertain to the evaluation of patients at risk for COVID-19 are in a state of rapid change based on information released by regulatory bodies including the CDC and federal and state organizations. These policies and algorithms were followed during the patient's care in the ED.  ____________________________________________   FINAL CLINICAL IMPRESSION(S) / ED DIAGNOSES  Paresthesias   Harvest Dark, MD 01/05/20 1952

## 2020-01-05 NOTE — ED Provider Notes (Signed)
----------------------------------------- °  9:38 PM on 01/05/2020 -----------------------------------------  Blood pressure 127/72, pulse 99, temperature 98.5 F (36.9 C), temperature source Oral, resp. rate 18, height 5\' 4"  (1.626 m), weight 81.6 kg, SpO2 97 %.  Assuming care from Dr. Kerman Passey.  In short, Deanna Hall is a 44 y.o. female with a chief complaint of Numbness .  Refer to the original H&P for additional details.  The current plan of care is to await MRI results.  Patient presented to the emergency department complaining of left arm and leg numbness.  Patient states that she is even had some of the symptoms into her face.  Patient has been evaluated and managed by Dr. Kerman Passey.  Patient care was handed to myself as we were awaiting MRI results to ensure no lesions or masses in the brain.  MRI results are reassuring.  Exam remains reassuring.  No indication for any further work-up.  At this time patient has a diagnosis of numbness and paresthesias.  Patient states that she cannot take anti-inflammatories, steroids, so I will prescribe the patient a muscle relaxer as symptoms started in the extremities and progressed to the face.  Patient can take Tylenol at home.  Otherwise follow-up with primary care.  ED diagnosis:  Paresthesia Numbness    Guerry Minors Charline Bills, PA-C 01/05/20 2142    Harvest Dark, MD 01/06/20 [phone]

## 2020-01-05 NOTE — ED Triage Notes (Signed)
Says she noticed numbness left thigh area and back of ankle yesterday and when she woke up she had numbness left elbow and left face under left eye.  No facial droop,  No arm drift.  Says we\hen she got up the left leg felt heavy, but no loss of function.

## 2021-03-26 ENCOUNTER — Other Ambulatory Visit: Payer: Self-pay

## 2021-03-26 ENCOUNTER — Encounter: Payer: Self-pay | Admitting: *Deleted

## 2021-03-26 ENCOUNTER — Ambulatory Visit
Admission: RE | Admit: 2021-03-26 | Discharge: 2021-03-26 | Disposition: A | Discharge status: Home / Self Care | Payer: Self-pay | Source: Ambulatory Visit | Attending: Oncology | Admitting: Oncology

## 2021-03-26 ENCOUNTER — Ambulatory Visit: Payer: Self-pay | Attending: Oncology | Admitting: *Deleted

## 2021-03-26 VITALS — BP 128/86 | HR 96 | Temp 99.6°F | Ht 65.5 in | Wt 175.0 lb

## 2021-03-26 DIAGNOSIS — Z0000 Encounter for general adult medical examination without abnormal findings: Secondary | ICD-10-CM | POA: Insufficient documentation

## 2021-03-26 IMAGING — MG MM DIGITAL SCREENING BILAT W/ TOMO AND CAD
8 series · 9 of 24 positions shown · non-contrast
Comparison: Previous exam(s).

CLINICAL DATA: Screening.

EXAM:
DIGITAL SCREENING BILATERAL MAMMOGRAM WITH TOMOSYNTHESIS AND CAD
TECHNIQUE: Bilateral screening digital craniocaudal and mediolateral oblique
mammograms were obtained. Bilateral screening digital breast
tomosynthesis was performed. The images were evaluated with
computer-aided detection.

[L CC synth-2D]
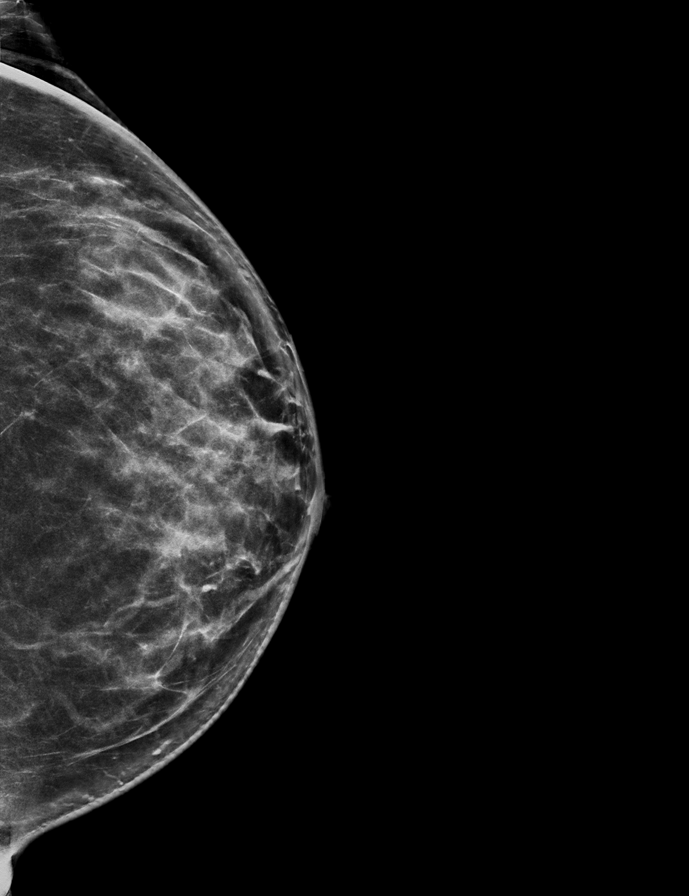

[R MLO synth-2D]
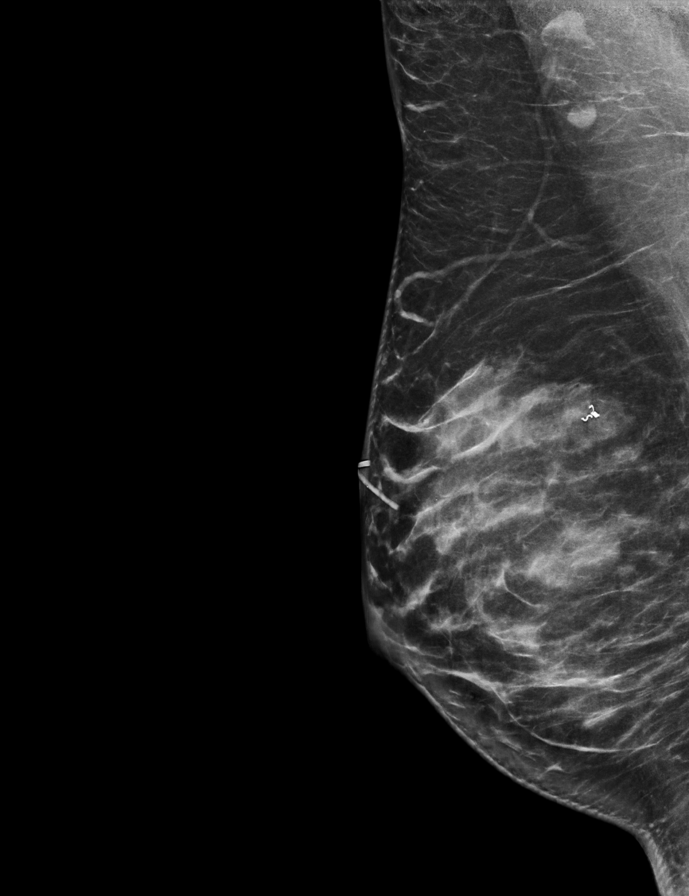

[L MLO synth-2D]
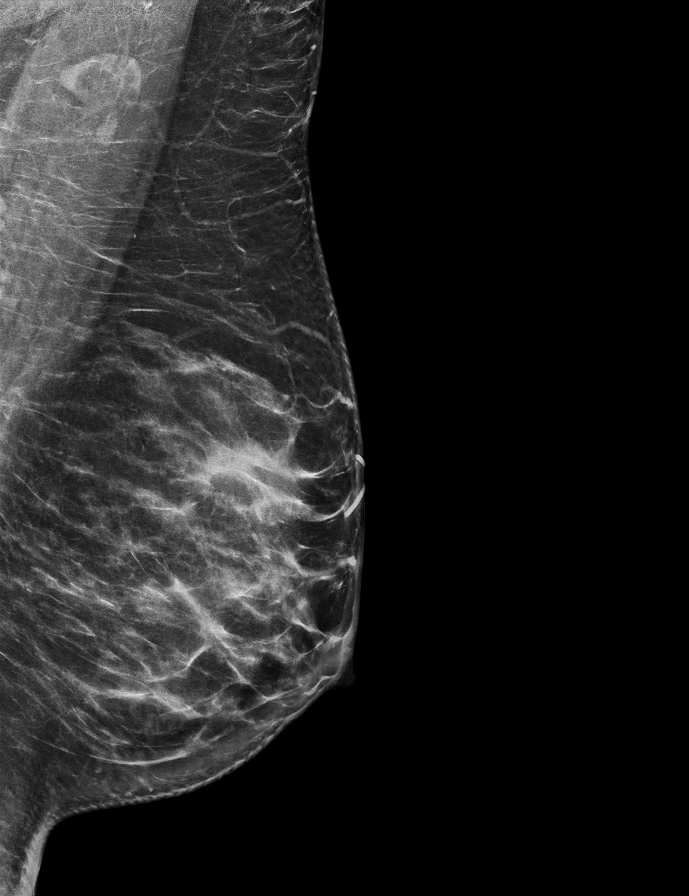

[R CC synth-2D]
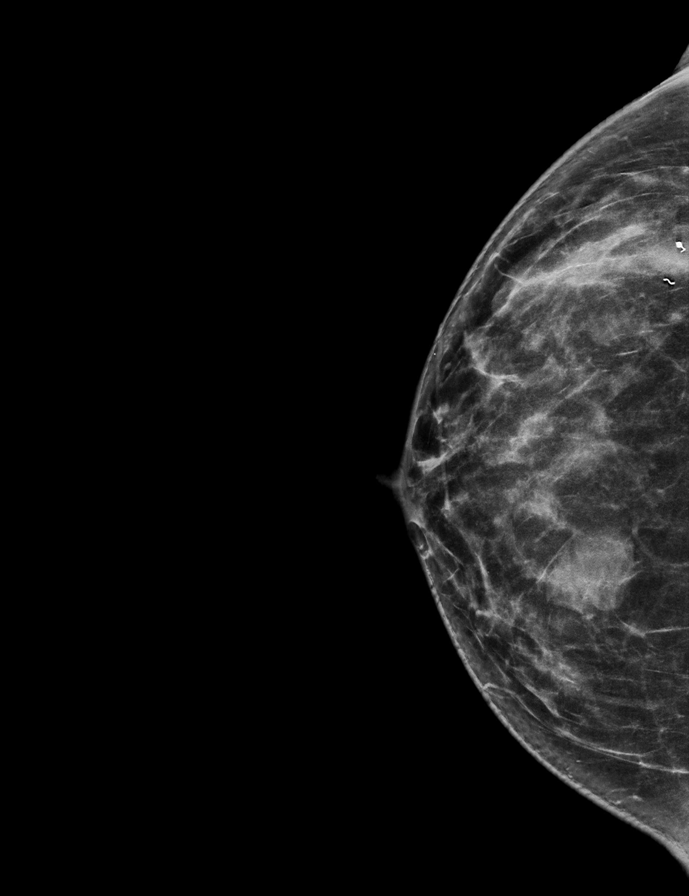

[L CC tomo · 2 of 75 frames shown]
[frame 25/75]
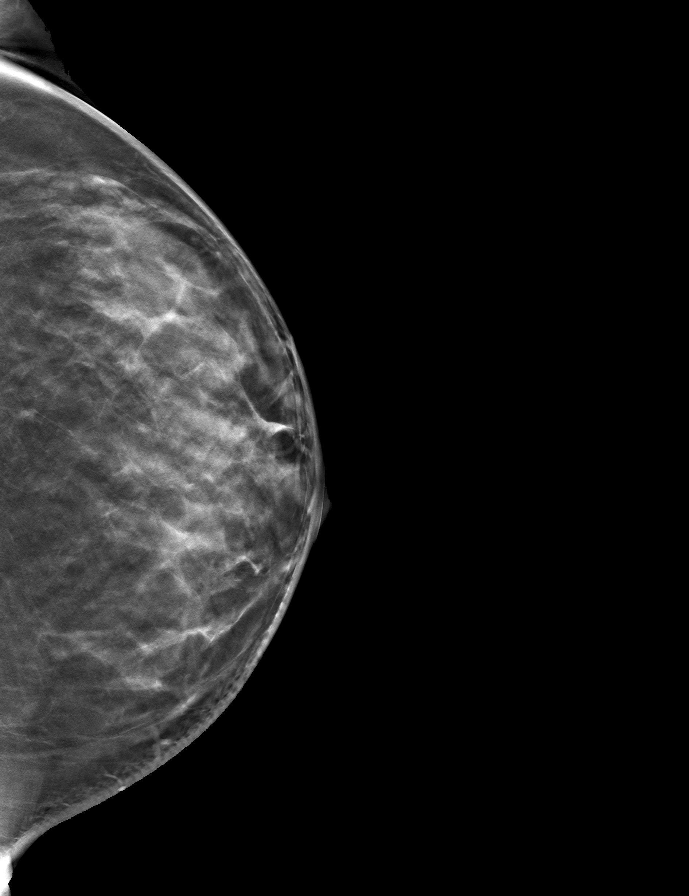
[frame 38/75]
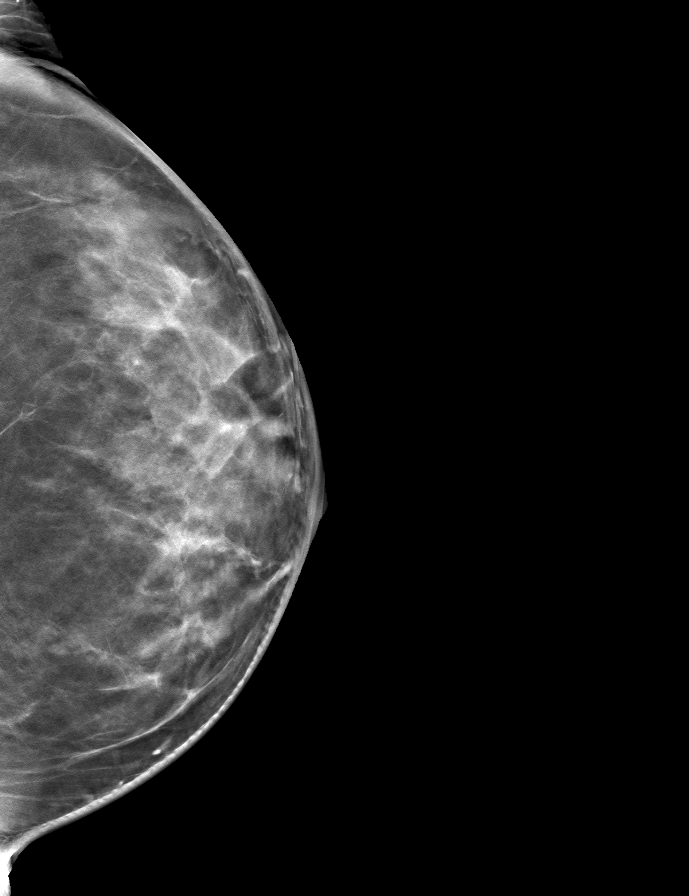

[R CC tomo · tomo slice 35/69.0]
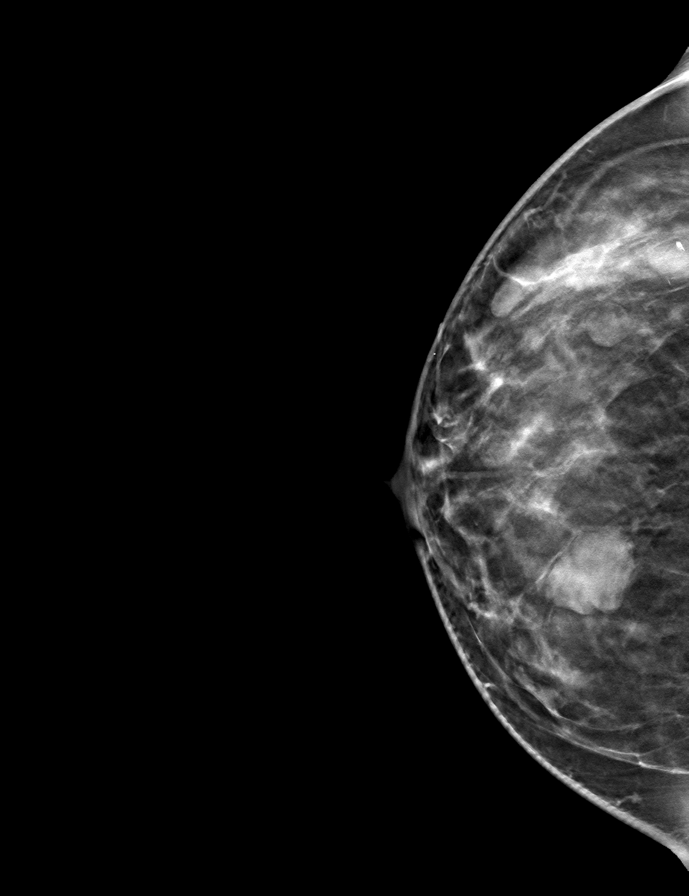

[L MLO tomo · tomo slice 37/74.0]
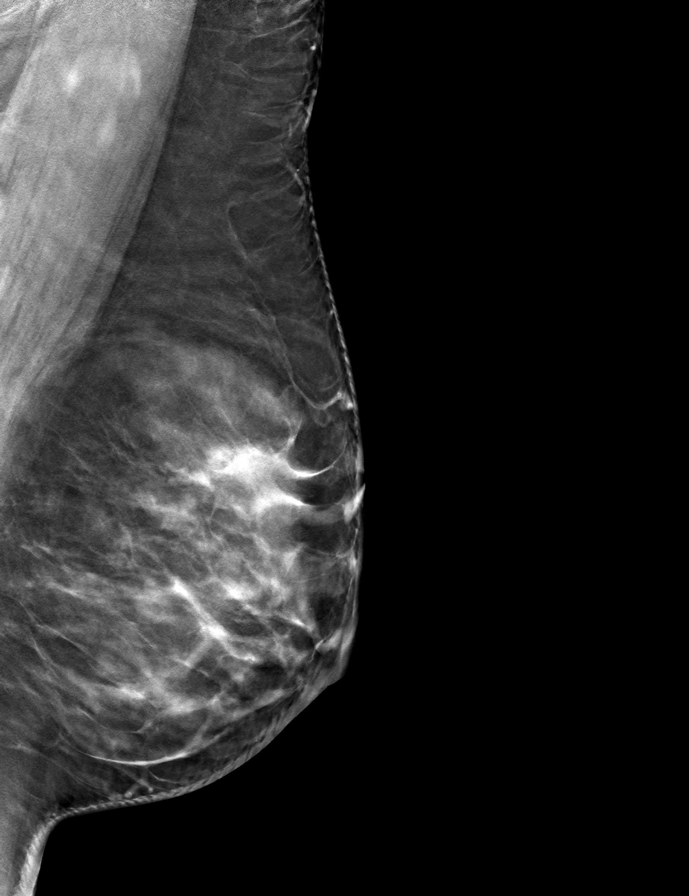

[R MLO tomo · tomo slice 37/74.0]
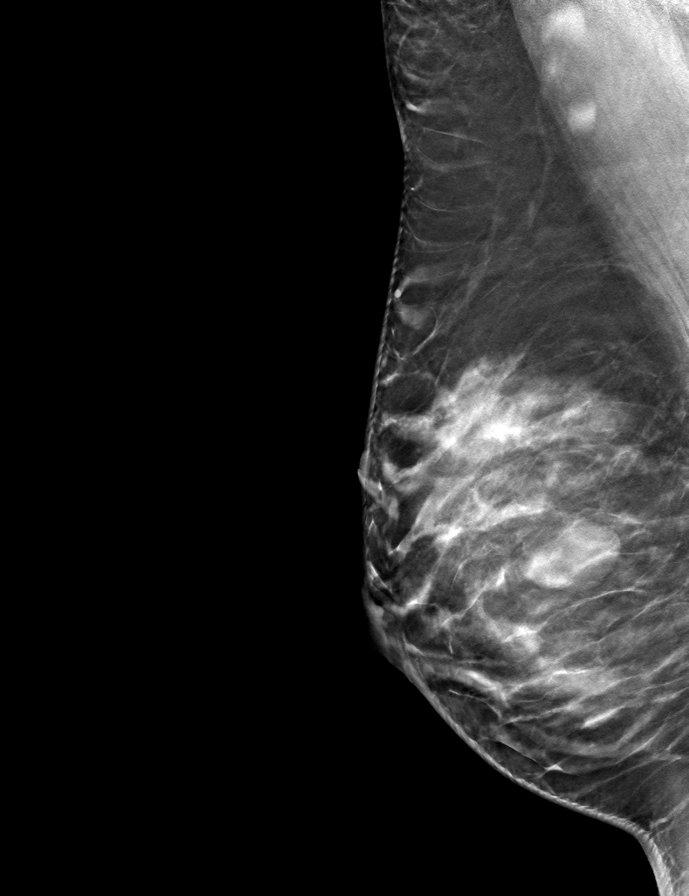

[9 of 24 positions shown; findings below may reference images not displayed]

ACR Breast Density Category c: The breast tissue is heterogeneously
dense, which may obscure small masses.
FINDINGS: In the right breast, 2 possible masses warrant further evaluation.
In the left breast, no findings suspicious for malignancy.
IMPRESSION: Further evaluation is suggested for 2 possible masses in the right
breast.

RECOMMENDATION:
Ultrasound of the right breast. (Code:[4Z])

The patient will be contacted regarding the findings, and additional
imaging will be scheduled.

BI-RADS CATEGORY  0: Incomplete. Need additional imaging evaluation
and/or prior mammograms for comparison.

## 2021-03-26 NOTE — Progress Notes (Signed)
Subjective:     Patient ID: Deanna Hall, female   DOB: 08/18/1975, 45 y.o.   MRN: [id number]  HPI  [facility] Medical History Record - 03/26/21 1044       Breast History   Screening cycle Rescreen    CBE Date 02/05/21    Provider (CBE) [facility]    Initial Mammogram 03/26/21    Last Mammogram Annual    Last Mammogram Date 07/07/17    Provider (Mammogram)  [facility]    Recent Breast Symptoms --   bilateral breast pain     Breast Cancer History   Breast Cancer History No personal or family history      Previous History of Breast Problems   Breast Surgery or Biopsy --   bilateral   Breast Implants N/A    BSE Done Monthly      Gynecological/Obstetrical History   LMP --   no period in several years - always very irregular   Is there any chance that the client could be pregnant?  No    Age at menarche 53    PAP smear history Annually    Date of last PAP  03/30/19    Provider (PAP) [facility]    Age at first live birth 39    Breast fed children Yes (type length in comments)   6 months   DES Exposure Unkown    Cervical, Uterine or Ovarian cancer No    Family history of Cervial, Uterine or Ovarian cancer No    Hysterectomy No    Cervix removed No    Ovaries removed No    Laser/Cryosurgery No    Current method of birth control Other (see comments)   IUD   Current method of Estrogen/Hormone replacement None    Smoking history None               Review of Systems     Objective:   Physical Exam Chest:  Breasts:    Right: No swelling, bleeding, inverted nipple, mass, nipple discharge, skin change or tenderness.     Left: No swelling, bleeding, inverted nipple, mass, nipple discharge, skin change or tenderness.       Comments: Patient complains of bilateral intermittent breast pain Lymphadenopathy:     Upper Body:     Right upper body: No supraclavicular or axillary adenopathy.     Left upper body: No supraclavicular or axillary adenopathy.       Assessment:     45 year old Hispanic female returns to [facility] for annual screening.  Maritza, the interpreter present during the interview and exam.  Patient with a history of a benign right breast biopsy in 2019, showing a fibroadenoma.  Clinical breast exam with bilateral fibroglandular like tissue.  Patient states she has intermittent bilateral breast pain.  States her PCP says it's related to her breast cyst. Taught self breast awareness.  Last pap completed at the [facility] in 2021.  Those results are not available for review.  Patient has been screened for eligibility.  She does not have any insurance, Medicare or Medicaid.  She also meets financial eligibility.   Risk Assessment     Risk Scores       03/26/2021   Last edited by: Rico Junker, RN   5-year risk: 1 %   Lifetime risk: 8.6 %               Plan:     Screening mammogram  ordered.  Will follow up per [facility] protocol.

## 2021-03-27 ENCOUNTER — Other Ambulatory Visit: Payer: Self-pay | Admitting: *Deleted

## 2021-03-27 DIAGNOSIS — N6312 Unspecified lump in the right breast, upper inner quadrant: Secondary | ICD-10-CM

## 2021-04-12 ENCOUNTER — Other Ambulatory Visit: Payer: Self-pay

## 2021-04-12 ENCOUNTER — Ambulatory Visit
Admission: RE | Admit: 2021-04-12 | Discharge: 2021-04-12 | Disposition: A | Discharge status: Home / Self Care | Payer: Self-pay | Source: Ambulatory Visit | Attending: Oncology | Admitting: Oncology

## 2021-04-12 DIAGNOSIS — N6312 Unspecified lump in the right breast, upper inner quadrant: Secondary | ICD-10-CM | POA: Insufficient documentation

## 2021-04-12 IMAGING — US US BREAST*R* LIMITED INC AXILLA
3 series · 13 of 25 positions shown · non-contrast
Comparison: Previous exam(s).

CLINICAL DATA: The patient was called back for 2 new right breast
masses. A previously biopsied mass in the right breast contains
biopsy clips and was benign at the time of previous biopsy.

EXAM:
ULTRASOUND OF THE RIGHT BREAST

[Series 1: us breast*right* limited inc axilla · 0.07mm/px · 4 of 12 slices shown (1 of 3)]
[im 1/12]
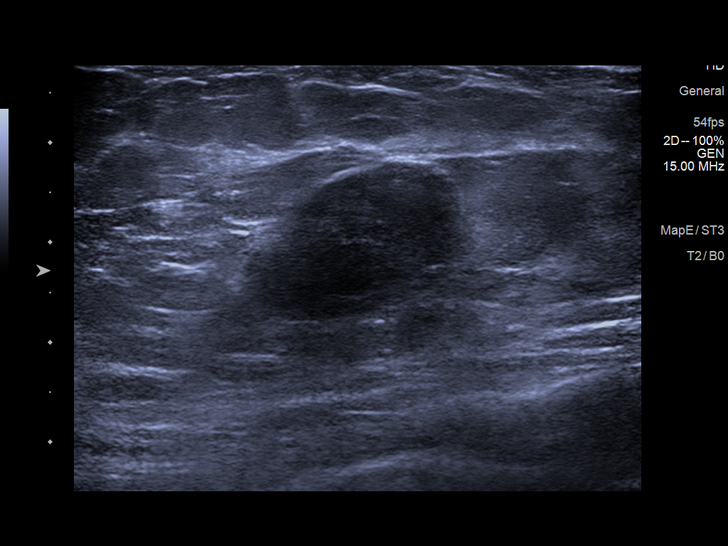
[im 4/12]
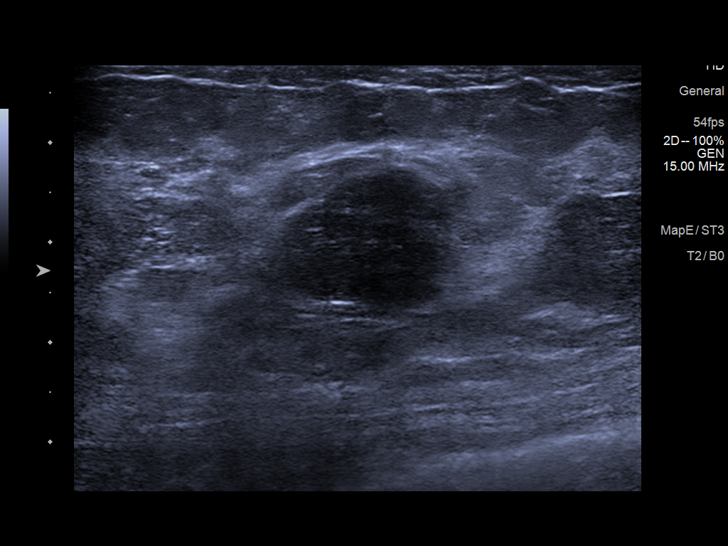
[im 7/12]
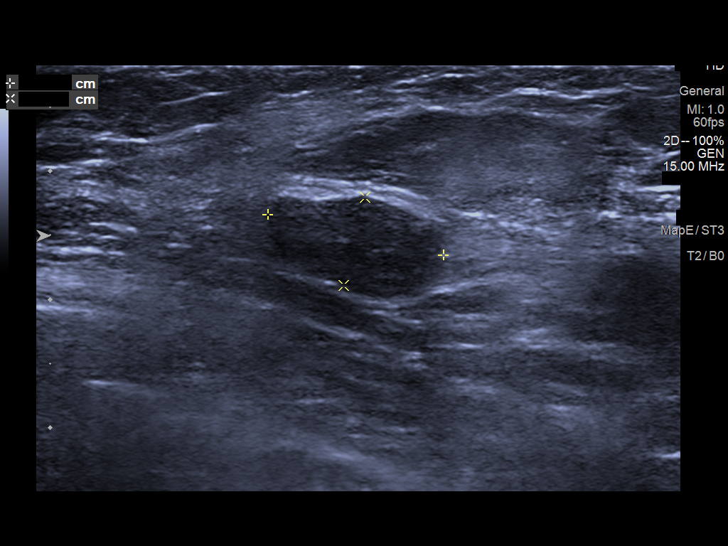
[im 10/12]
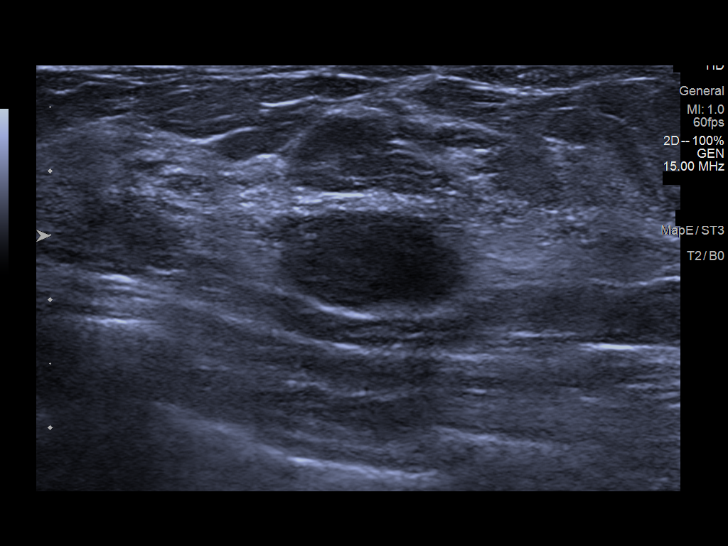

[Series 1: us breast*right* limited inc axilla · 0.07mm/px · 5 of 14 slices shown (2 of 3)]
[im 1/14]
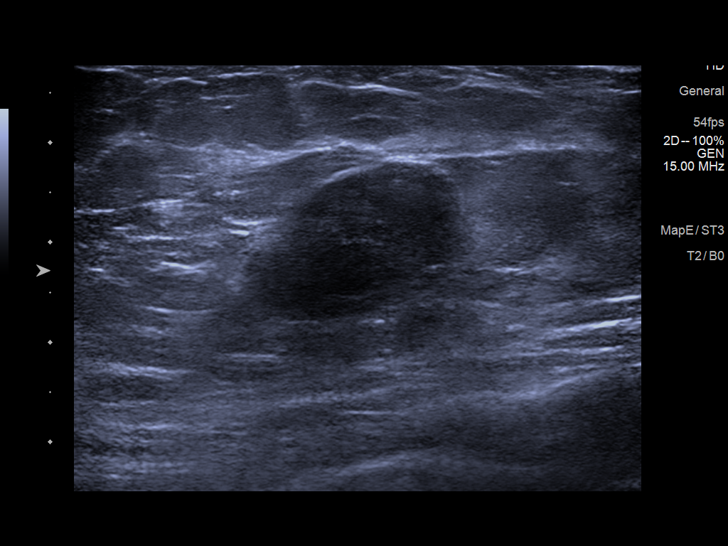
[im 4/14]
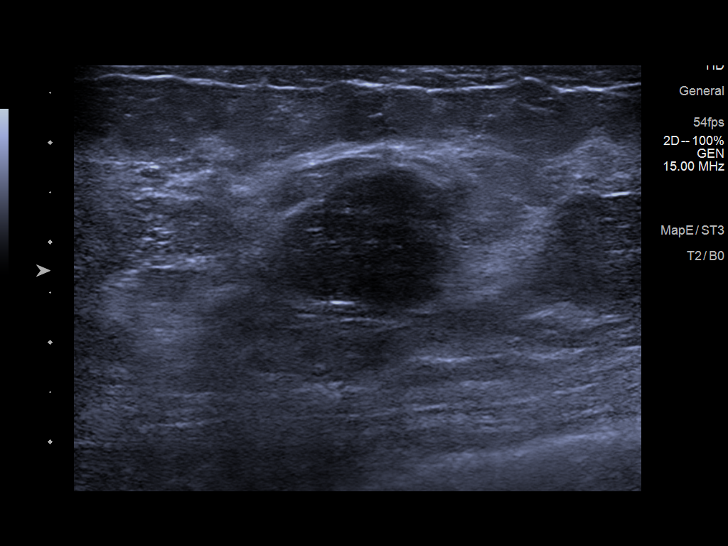
[im 7/14]
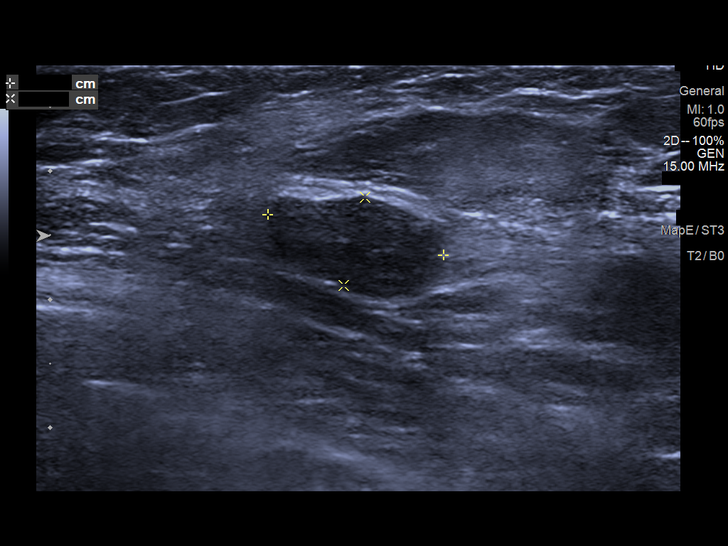
[im 10/14]
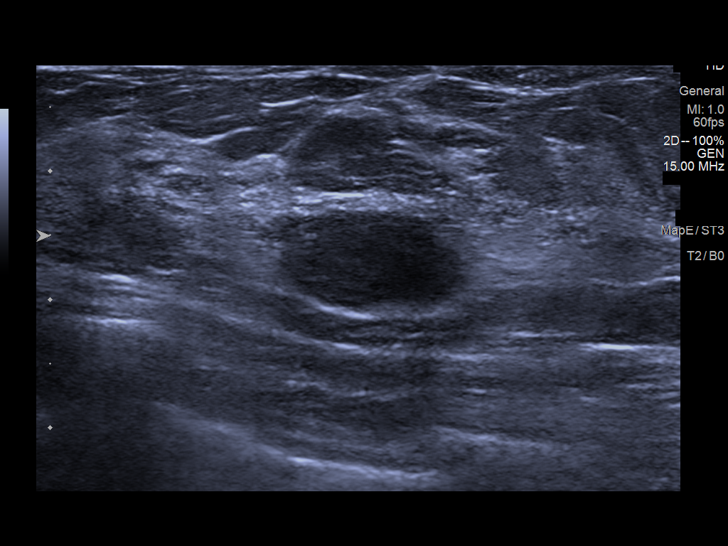
[im 14/14]
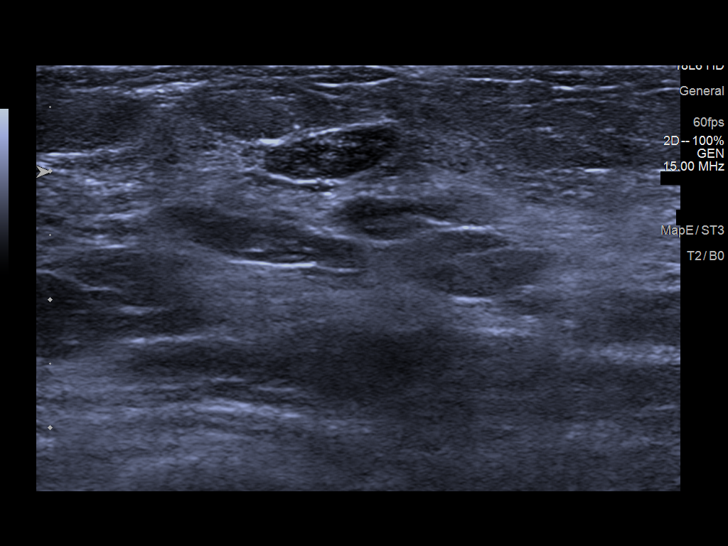

[Series 2: us breast*right* limited inc axilla · 0.06mm/px · 4 of 12 slices shown (3 of 3)]
[im 2/12]
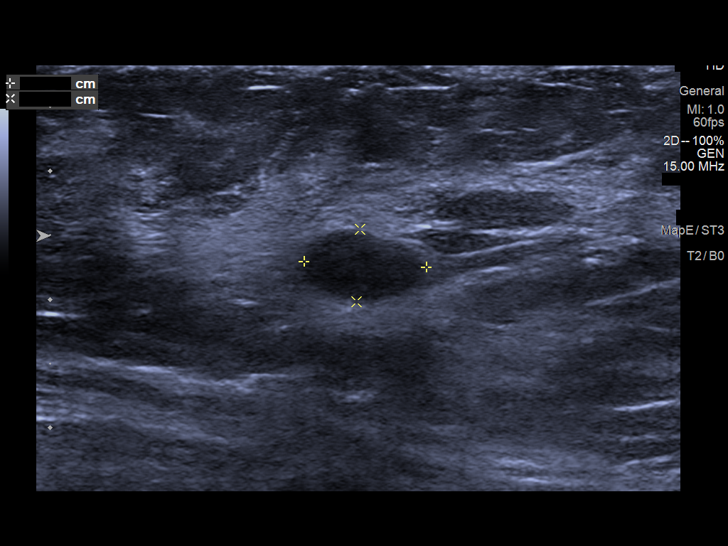
[im 5/12]
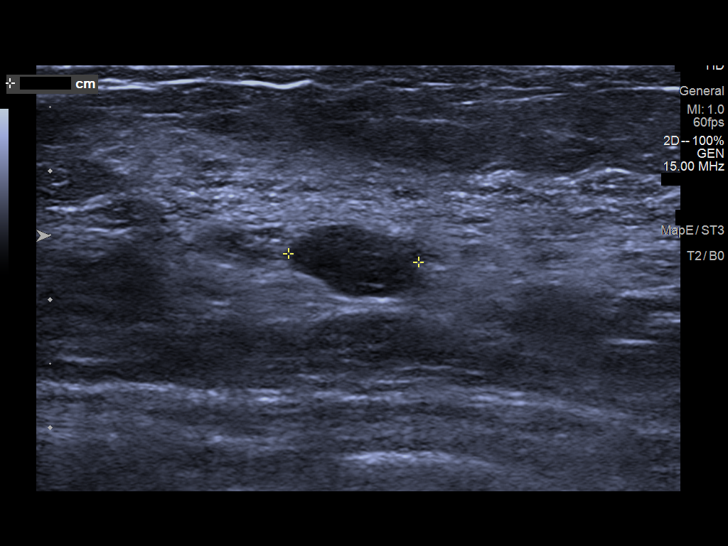
[im 8/12]
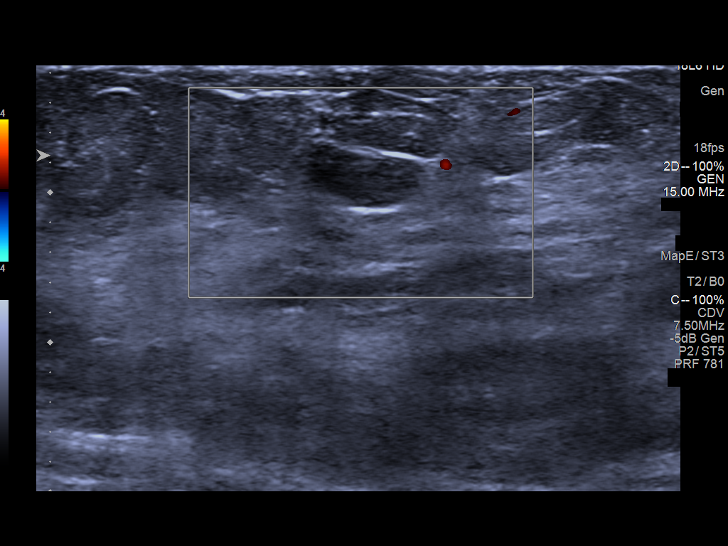
[im 12/12]
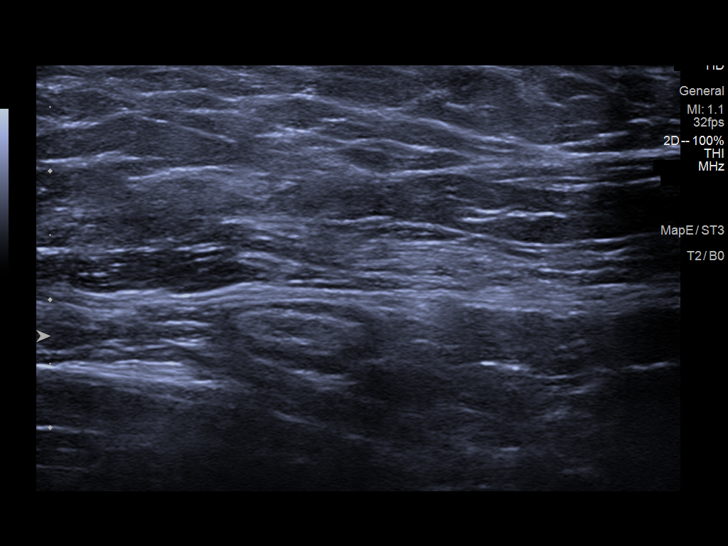

[13 of 25 positions shown; findings below may reference images not displayed]

FINDINGS: Targeted ultrasound is performed, showing 3 new masses in the right
breast.

The first is seen at 130, 4 cm from the nipple measuring 2.2 x 1.4 x
1.8 cm.

The second is seen at 10 o'clock, 3 cm from the nipple measuring
x 0.6 x 1.0 cm.

The third seen at 10 o'clock, 2 cm from the nipple measuring 0.8 x
0.4 by 1.0 cm.

No axillary adenopathy.

Incidentally, the previously biopsied benign mass was seen at [DATE],
6 cm from the nipple containing biopsy clips.
IMPRESSION: Three new right breast masses.

RECOMMENDATION:
Recommend ultrasound-guided biopsy of the 3 new right breast masses.

I have discussed the findings and recommendations with the patient.
If applicable, a reminder letter will be sent to the patient
regarding the next appointment.

BI-RADS CATEGORY  4: Suspicious.

## 2021-04-16 ENCOUNTER — Other Ambulatory Visit: Payer: Self-pay | Admitting: *Deleted

## 2021-04-16 ENCOUNTER — Other Ambulatory Visit: Payer: Self-pay | Admitting: Obstetrics and Gynecology

## 2021-04-16 DIAGNOSIS — N6310 Unspecified lump in the right breast, unspecified quadrant: Secondary | ICD-10-CM

## 2021-05-02 ENCOUNTER — Ambulatory Visit
Admission: RE | Admit: 2021-05-02 | Discharge: 2021-05-02 | Disposition: A | Discharge status: Home / Self Care | Payer: Self-pay | Source: Ambulatory Visit | Attending: Obstetrics and Gynecology | Admitting: Obstetrics and Gynecology

## 2021-05-02 ENCOUNTER — Other Ambulatory Visit: Payer: Self-pay

## 2021-05-02 DIAGNOSIS — N6310 Unspecified lump in the right breast, unspecified quadrant: Secondary | ICD-10-CM

## 2021-05-02 HISTORY — PX: BIOPSY BREAST: PRO8

## 2021-05-02 IMAGING — MG MM BREAST LOCALIZATION CLIP RIGHT
4 series · 4 of 12 positions shown · non-contrast
Comparison: Previous exam(s).

CLINICAL DATA: Status post 3 site ultrasound-guided biopsy

EXAM:
DIAGNOSTIC RIGHT MAMMOGRAM POST ULTRASOUND BIOPSY

[R CC synth-2D]
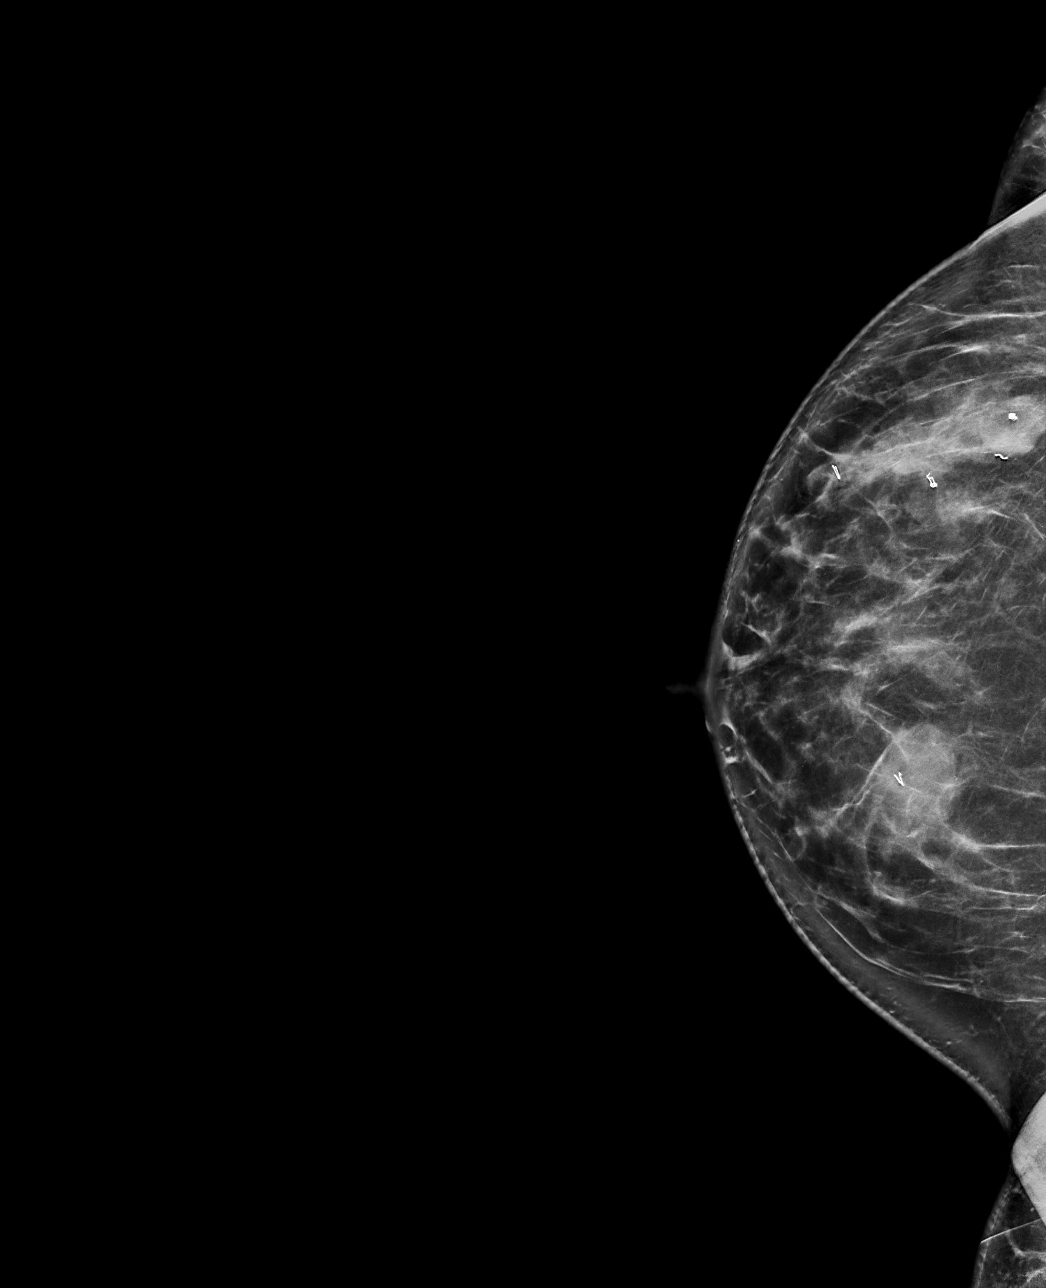

[R ML synth-2D]
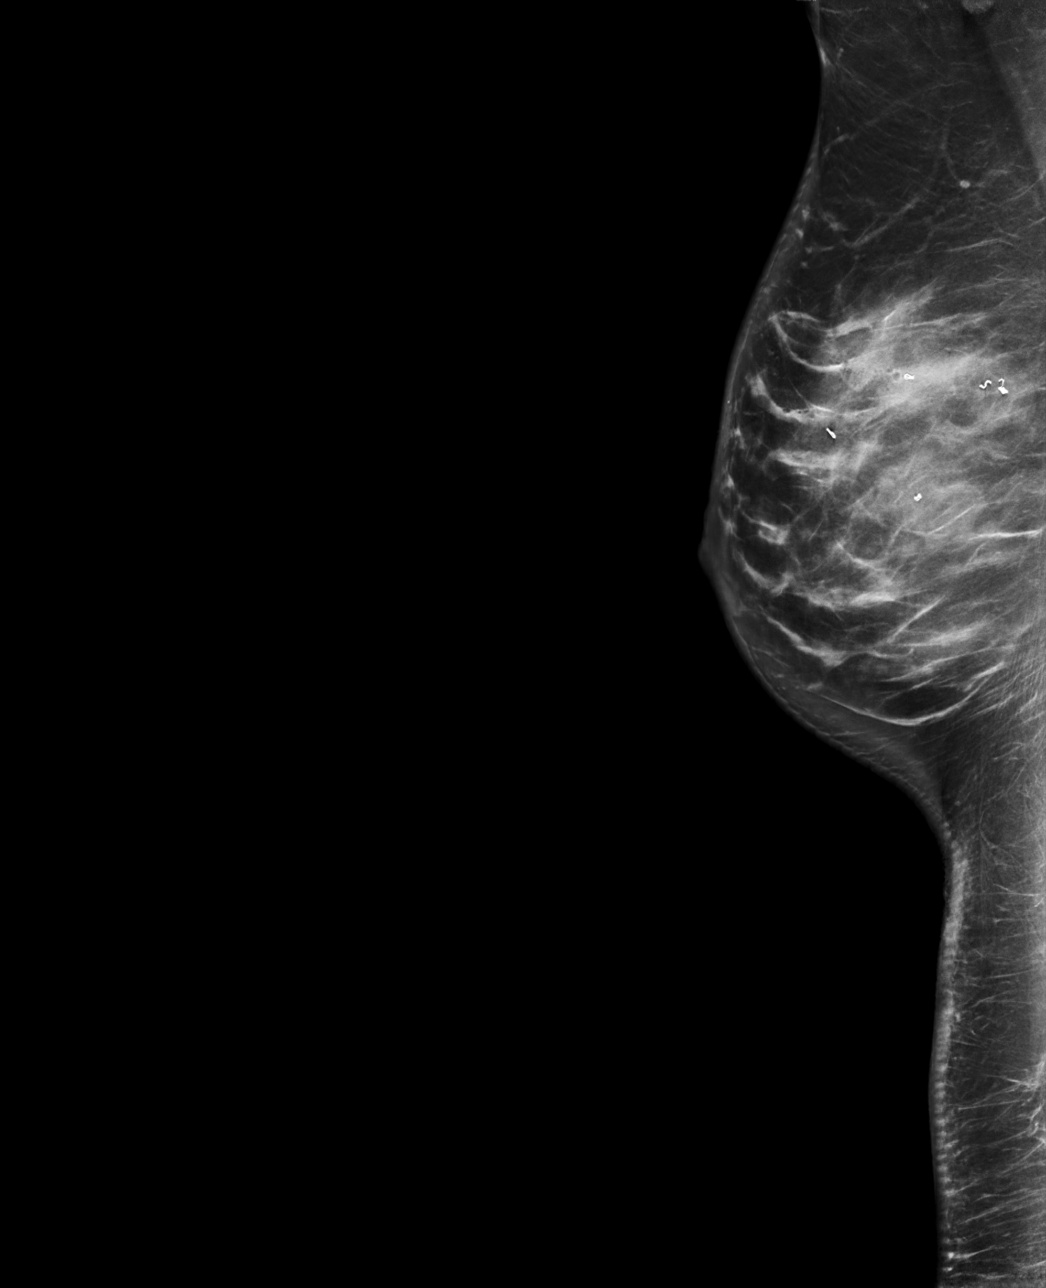

[R CC tomo · tomo slice 45/90.0]
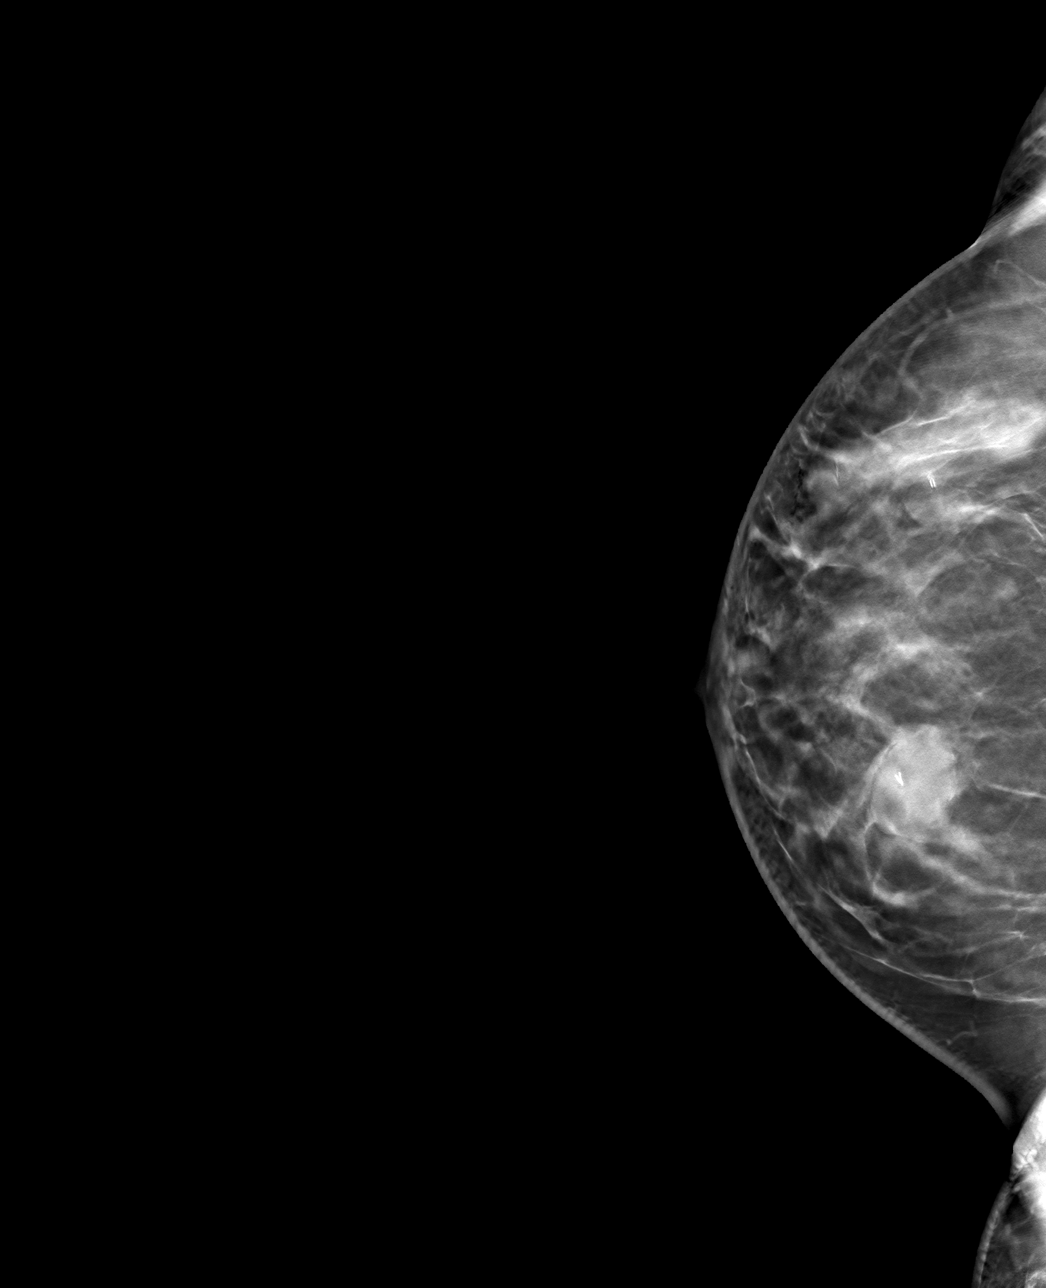

[R ML tomo · tomo slice 50/99.0]
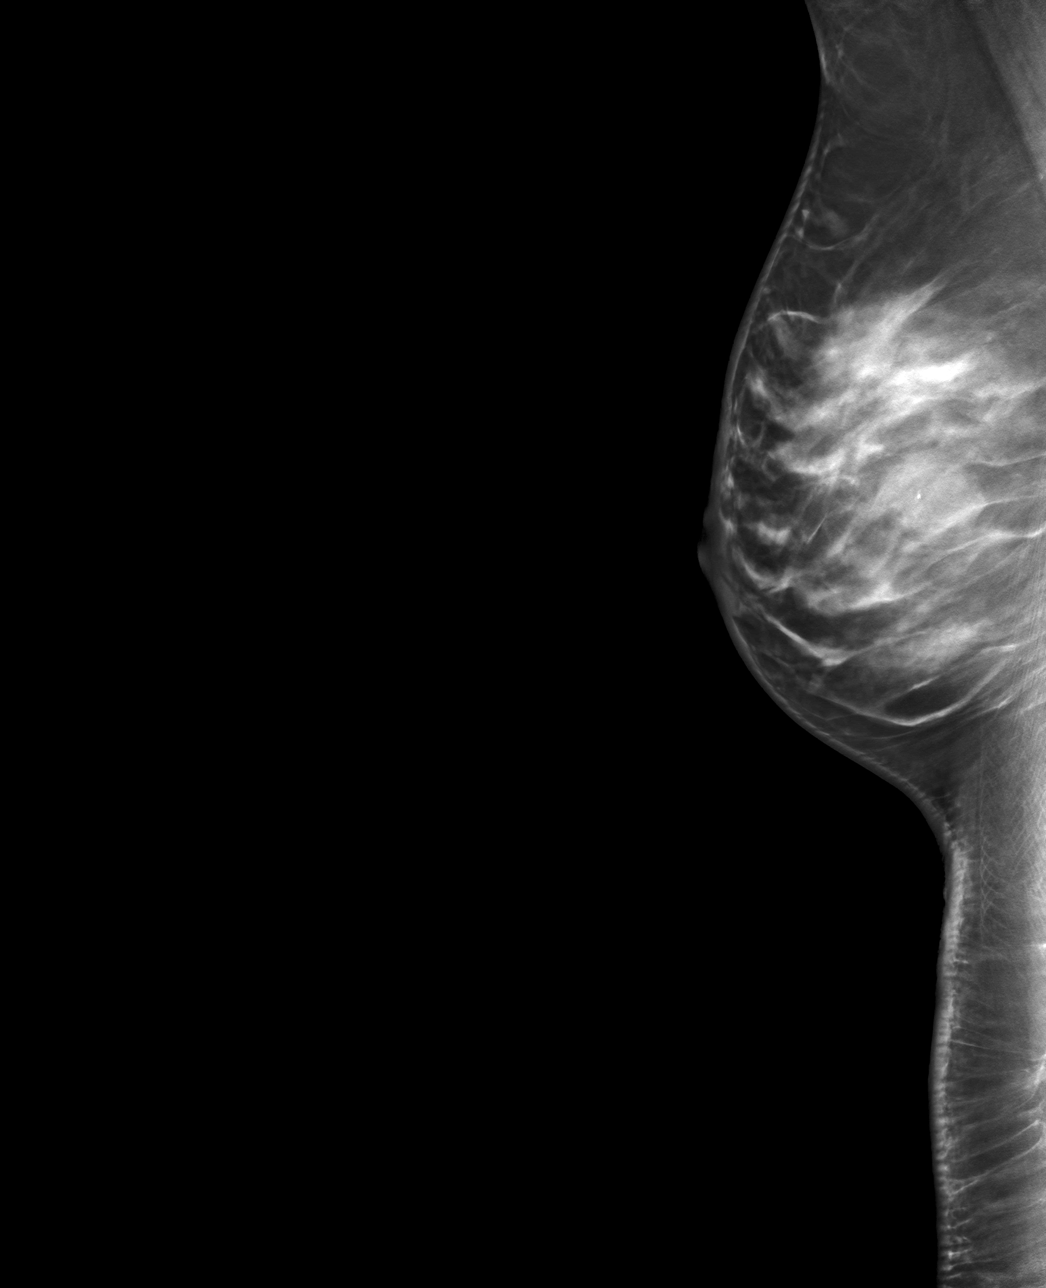

[4 of 12 positions shown; findings below may reference images not displayed]

FINDINGS: Site 1: [DATE] 4 cm from the nipple

Mammographic images were obtained following ultrasound guided biopsy
of a mass at [DATE]. The heart biopsy marking clip is in expected
position at the site of biopsy.

Site 2:10 o'clock 3 cm from the nipple

Mammographic images were obtained following ultrasound guided biopsy
of a mass at 10 o'clock. The EBELIN biopsy marking clip is in
expected position at the site of biopsy.

Site 3:10 o'clock 2 cm from the nipple

Mammographic images were obtained following ultrasound guided biopsy
of a mass at 10 o'clock. The RIBBON biopsy marking clip is in
expected position at the site of biopsy.
IMPRESSION: 1. Appropriate positioning of the HEART shaped biopsy marking clip
at the site of biopsy in the RIGHT upper inner breast.
2. Appropriate positioning of the EBELIN shaped biopsy marking clip
at the site of biopsy in the RIGHT upper outer breast.
3. Appropriate positioning of the RIBBON shaped biopsy marking clip
at the site of biopsy in the RIGHT upper outer breast.

Final Assessment: Post Procedure Mammograms for Marker Placement

## 2021-05-02 IMAGING — MG US  BREAST BX W/ LOC DEV 1ST LESION IMG BX SPEC US GUIDE*R*
1 series · 3 of 8 positions shown · non-contrast
Comparison: Previous exam(s).
COMPARISON: Previous exam(s).
COMPARISON: Previous exam(s).

Addendum:
CLINICAL DATA: Three indeterminate RIGHT breast mass

EXAM:
ULTRASOUND GUIDED RIGHT BREAST CORE NEEDLE BIOPSY x 3

[Series 1: MG view · 0.07mm/px · 3 of 45 slices shown]
[im 1/45]
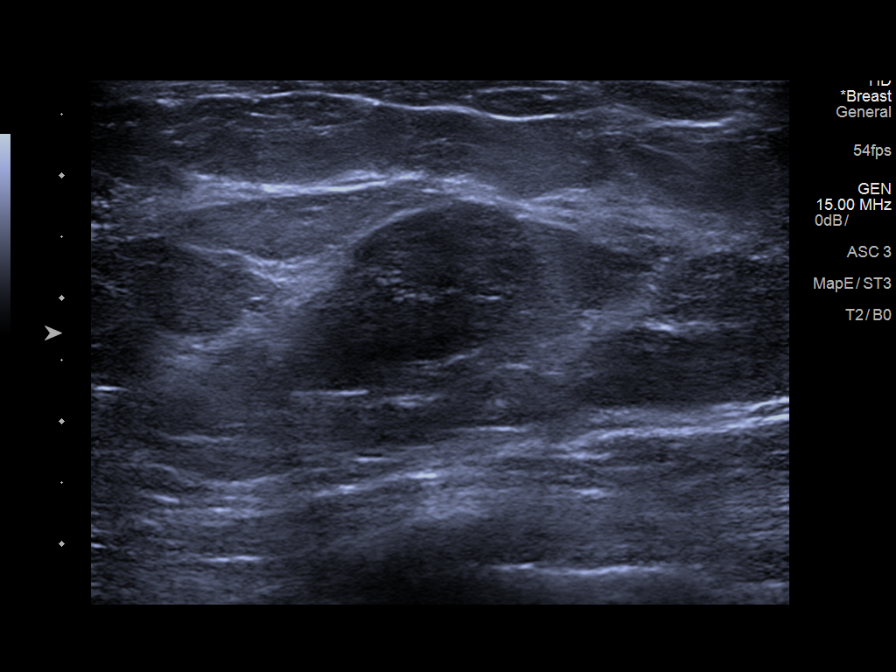
[im 7/45]
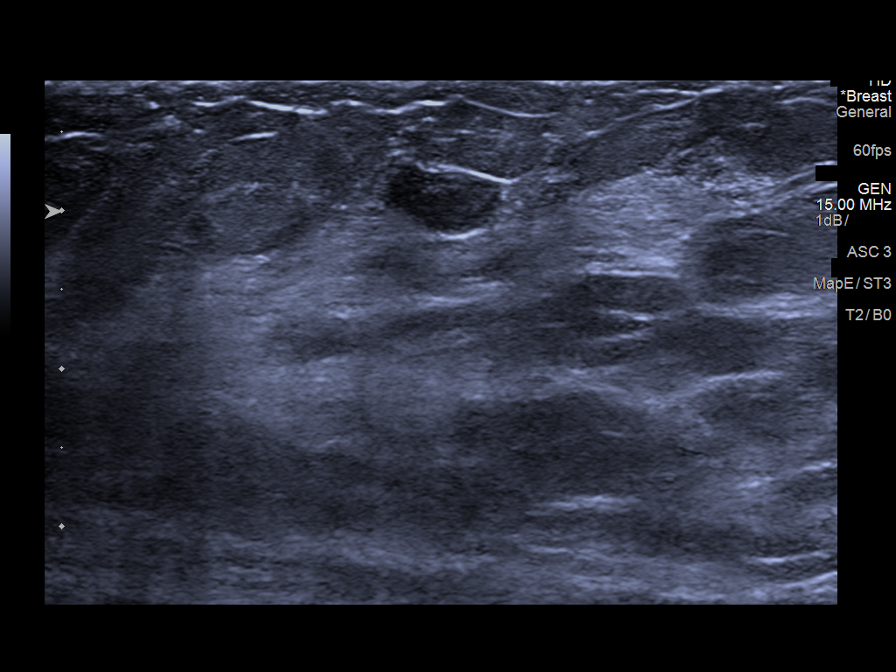
[im 13/45]
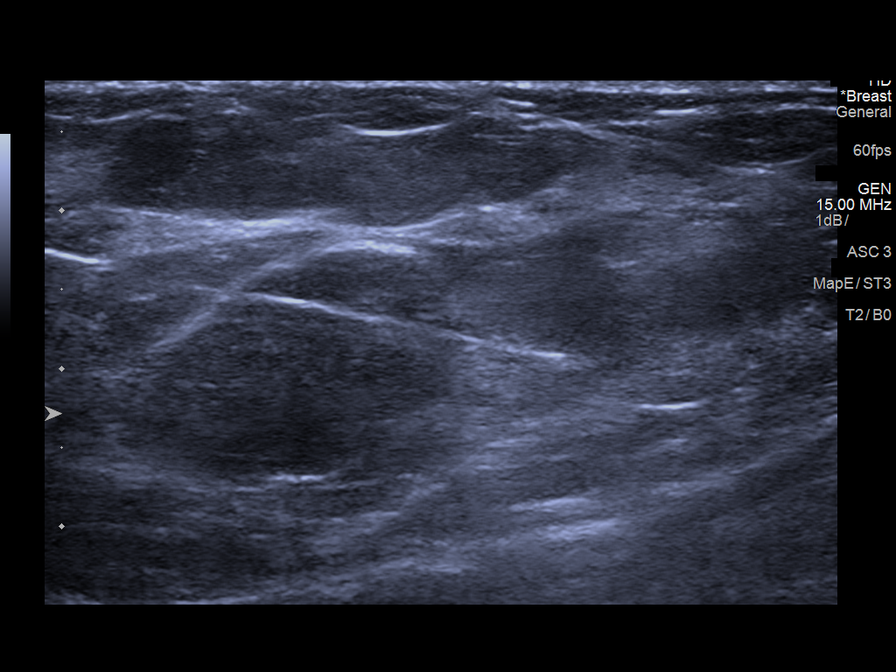

[3 of 8 positions shown; findings below may reference images not displayed]



Site 1: [DATE] 4 cm from the nipple

Lesion quadrant: Upper inner quadrant

Using sterile technique and 1% lidocaine and 1% lidocaine with
epinephrine as local anesthetic, under direct ultrasound
visualization, a 14 gauge SCHOLZ device was used to perform
biopsy of a mass at [DATE] using a lateral approach. At the conclusion
of the procedure a HEART tissue marker clip was deployed into the
biopsy cavity. Follow up 2 view mammogram was performed and dictated
separately.

Site 2: 10 o'clock 3 cm from the nipple

Lesion quadrant: Upper outer quadrant

Using sterile technique and 1% lidocaine and 1% lidocaine with
epinephrine as local anesthetic, under direct ultrasound
visualization, a 14 gauge SCHOLZ device was used to perform
biopsy of a mass at 10 o'clock using a lateral approach. At the
conclusion of the procedure SCHOLZ tissue marker clip was deployed
into the biopsy cavity. Follow up 2 view mammogram was performed and
dictated separately.

Site 3: 10 o'clock 2 cm from the nipple

Lesion quadrant: Upper outer quadrant

Using sterile technique and 1% lidocaine and 1% lidocaine with
epinephrine as local anesthetic, under direct ultrasound
visualization, a 14 gauge SCHOLZ device was used to perform
biopsy of a mass at 10 o'clock using a lateral approach. At the
conclusion of the procedure a RIBBON tissue marker clip was deployed
into the biopsy cavity. Follow up 2 view mammogram was performed and
dictated separately.
IMPRESSION: Ultrasound guided biopsy of 3 RIGHT breast masses. No apparent
complications.

ADDENDUM:
PATHOLOGY revealed: Site A. BREAST, RIGHT [DATE] 4 CM FN (HEART CLIP);
ULTRASOUND-GUIDED BIOPSY: - FIBROEPITHELIAL LESION. Comment:
Sections demonstrate cores of a fibroepithelial lesion with
intracanalicular architecture, mildly cellular myxoid stroma with
minimal cytologic atypia, and intervening areas of fibrous stroma.
An increase in mitotic figures is not identified. The differential
diagnosis includes a fibroadenoma with intracanalicular growth
pattern and benign phyllodes tumor. Malignancy is not identified in
the material sampled. These findings may not be representative of a
larger lesion. Correlation with clinical impression and radiographic
findings is required.

Pathology results are CONCORDANT with imaging findings, per Dr.
SCHOLZ with excision recommended.

PATHOLOGY revealed: Site B. BREAST, RIGHT [DATE] 3 CM FN (VENUS
CLIP); ULTRASOUND-GUIDED BIOPSY: - FIBROEPITHELIAL LESION, SEE
COMMENT. - ADJACENT TISSUE WITH COLUMNAR CELL CHANGE AND APOCRINE
METAPLASIA. Comment: Sections demonstrate cores of a fibroepithelial
lesion with mixed intracanalicular and pericanalicular architecture,
moderately cellular myxoid stroma with mild cytologic atypia, and
intervening areas of fibrous stroma. An increase in mitotic figures
is not identified. The differential diagnosis includes a
fibroadenoma and benign phyllodes tumor. Malignancy is not
identified in the material sampled. These findings may not be
representative of a larger lesion. Correlation with clinical
impression and radiographic findings is required.

Pathology results are CONCORDANT with imaging findings, per Dr.
SCHOLZ.

PATHOLOGY revealed: Site C. BREAST, RIGHT [DATE] 2 CM FN (RIBBON
CLIP); ULTRASOUND-GUIDED BIOPSY: - FIBROEPITHELIAL LESION. Comment:
Sections demonstrate cores of a fibroepithelial lesion with
intracanalicular architecture and mildly cellular fibromyxoid stroma
with minimal cytologic atypia. An increase in mitotic figures is not
identified. The differential diagnosis includes a fibroadenoma with
intracanalicular growth pattern and benign phyllodes tumor.
Malignancy is not identified in the material sampled. These findings
may not be representative of a larger lesion. Correlation with
clinical impression and radiographic findings is required.

Pathology results are CONCORDANT with imaging findings, per Dr.
SCHOLZ.

Pathology results and recommendations below were discussed with
patient via [employer] # [YF] by telephone on [DATE].
Patient reported biopsy site within normal limits with slight
tenderness at the site. Post biopsy care instructions were reviewed,
questions were answered and my direct phone number was provided to
patient. Patient was instructed to call [HOSPITAL] if
any concerns or questions arise related to the biopsy.

RECOMMENDATIONS:

1.  Site A - Surgical consultation for possible excision.

2. Sites B and C - Surgical consult for consideration of excision
verses follow-up six month diagnostic mammogram to ensure stability.
Request for surgical consultation relayed to SCHOLZ RN and
SCHOLZ RN at [HOSPITAL] [HOSPITAL] by SCHOLZ
RN on [DATE].

Pathology results reported by SCHOLZ RN on [DATE].

ADDENDUM:
PATHOLOGY revealed: Site A. BREAST, RIGHT [DATE] 4 CM FN (HEART CLIP);
ULTRASOUND-GUIDED BIOPSY: - FIBROEPITHELIAL LESION. Comment:
Sections demonstrate cores of a fibroepithelial lesion with
intracanalicular architecture, mildly cellular myxoid stroma with
minimal cytologic atypia, and intervening areas of fibrous stroma.
An increase in mitotic figures is not identified. The differential
diagnosis includes a fibroadenoma with intracanalicular growth
pattern and benign phyllodes tumor. Malignancy is not identified in
the material sampled. These findings may not be representative of a
larger lesion. Correlation with clinical impression and radiographic
findings is required.

Pathology results are CONCORDANT with imaging findings, per Dr.
SCHOLZ with excision recommended.

PATHOLOGY revealed: Site B. BREAST, RIGHT [DATE] 3 CM FN (VENUS
CLIP); ULTRASOUND-GUIDED BIOPSY: - FIBROEPITHELIAL LESION, SEE
COMMENT. - ADJACENT TISSUE WITH COLUMNAR CELL CHANGE AND APOCRINE
METAPLASIA. Comment: Sections demonstrate cores of a fibroepithelial
lesion with mixed intracanalicular and pericanalicular architecture,
moderately cellular myxoid stroma with mild cytologic atypia, and
intervening areas of fibrous stroma. An increase in mitotic figures
is not identified. The differential diagnosis includes a
fibroadenoma and benign phyllodes tumor. Malignancy is not
identified in the material sampled. These findings may not be
representative of a larger lesion. Correlation with clinical
impression and radiographic findings is required.

Pathology results are CONCORDANT with imaging findings, per Dr.
SCHOLZ.

PATHOLOGY revealed: Site C. BREAST, RIGHT [DATE] 2 CM FN (RIBBON
CLIP); ULTRASOUND-GUIDED BIOPSY: - FIBROEPITHELIAL LESION. Comment:
Sections demonstrate cores of a fibroepithelial lesion with
intracanalicular architecture and mildly cellular fibromyxoid stroma
with minimal cytologic atypia. An increase in mitotic figures is not
identified. The differential diagnosis includes a fibroadenoma with
intracanalicular growth pattern and benign phyllodes tumor.
Malignancy is not identified in the material sampled. These findings
may not be representative of a larger lesion. Correlation with
clinical impression and radiographic findings is required.

Pathology results are CONCORDANT with imaging findings, per Dr.
SCHOLZ.

Pathology results and recommendations below were discussed with
patient via [employer] # [YF] by telephone on [DATE].
Patient reported biopsy site within normal limits with slight
tenderness at the site. Post biopsy care instructions were reviewed,
questions were answered and my direct phone number was provided to
patient. Patient was instructed to call [HOSPITAL] if
any concerns or questions arise related to the biopsy.

RECOMMENDATIONS:

1.  Site A - Surgical consultation for possible excision.

2. Sites B and C - Surgical consult for consideration of excision
verses follow-up six month diagnostic mammogram and US to ensure
stability. Request for surgical consultation relayed to SCHOLZ
RN and SCHOLZ RN at [HOSPITAL] [HOSPITAL] by SCHOLZ
SCHOLZ RN on [DATE].

Pathology results reported by SCHOLZ RN on [DATE].

*** End of Addendum ***
Addendum:
PROCEDURE:
I met with the patient and we discussed the procedure of
ultrasound-guided biopsy, including benefits and alternatives. We
discussed the high likelihood of a successful procedure. We
discussed the risks of the procedure, including infection, bleeding,
tissue injury, clip migration, and inadequate sampling. Informed
written consent was given. The usual time-out protocol was performed
immediately prior to the procedure.

Site 1: [DATE] 4 cm from the nipple

Lesion quadrant: Upper inner quadrant

Using sterile technique and 1% lidocaine and 1% lidocaine with
epinephrine as local anesthetic, under direct ultrasound
visualization, a 14 gauge SCHOLZ device was used to perform
biopsy of a mass at [DATE] using a lateral approach. At the conclusion
of the procedure a HEART tissue marker clip was deployed into the
biopsy cavity. Follow up 2 view mammogram was performed and dictated
separately.

Site 2: 10 o'clock 3 cm from the nipple

Lesion quadrant: Upper outer quadrant

Using sterile technique and 1% lidocaine and 1% lidocaine with
epinephrine as local anesthetic, under direct ultrasound
visualization, a 14 gauge SCHOLZ device was used to perform
biopsy of a mass at 10 o'clock using a lateral approach. At the
conclusion of the procedure SCHOLZ tissue marker clip was deployed
into the biopsy cavity. Follow up 2 view mammogram was performed and
dictated separately.

Site 3: 10 o'clock 2 cm from the nipple

Lesion quadrant: Upper outer quadrant

Using sterile technique and 1% lidocaine and 1% lidocaine with
epinephrine as local anesthetic, under direct ultrasound
visualization, a 14 gauge SCHOLZ device was used to perform
biopsy of a mass at 10 o'clock using a lateral approach. At the
conclusion of the procedure a RIBBON tissue marker clip was deployed
into the biopsy cavity. Follow up 2 view mammogram was performed and
dictated separately.
IMPRESSION: Ultrasound guided biopsy of 3 RIGHT breast masses. No apparent
complications.

ADDENDUM:
PATHOLOGY revealed: Site A. BREAST, RIGHT [DATE] 4 CM FN (HEART CLIP);
ULTRASOUND-GUIDED BIOPSY: - FIBROEPITHELIAL LESION. Comment:
Sections demonstrate cores of a fibroepithelial lesion with
intracanalicular architecture, mildly cellular myxoid stroma with
minimal cytologic atypia, and intervening areas of fibrous stroma.
An increase in mitotic figures is not identified. The differential
diagnosis includes a fibroadenoma with intracanalicular growth
pattern and benign phyllodes tumor. Malignancy is not identified in
the material sampled. These findings may not be representative of a
larger lesion. Correlation with clinical impression and radiographic
findings is required.

Pathology results are CONCORDANT with imaging findings, per Dr.
SCHOLZ with excision recommended.

PATHOLOGY revealed: Site B. BREAST, RIGHT [DATE] 3 CM FN (VENUS
CLIP); ULTRASOUND-GUIDED BIOPSY: - FIBROEPITHELIAL LESION, SEE
COMMENT. - ADJACENT TISSUE WITH COLUMNAR CELL CHANGE AND APOCRINE
METAPLASIA. Comment: Sections demonstrate cores of a fibroepithelial
lesion with mixed intracanalicular and pericanalicular architecture,
moderately cellular myxoid stroma with mild cytologic atypia, and
intervening areas of fibrous stroma. An increase in mitotic figures
is not identified. The differential diagnosis includes a
fibroadenoma and benign phyllodes tumor. Malignancy is not
identified in the material sampled. These findings may not be
representative of a larger lesion. Correlation with clinical
impression and radiographic findings is required.

Pathology results are CONCORDANT with imaging findings, per Dr.
SCHOLZ.

PATHOLOGY revealed: Site C. BREAST, RIGHT [DATE] 2 CM FN (RIBBON
CLIP); ULTRASOUND-GUIDED BIOPSY: - FIBROEPITHELIAL LESION. Comment:
Sections demonstrate cores of a fibroepithelial lesion with
intracanalicular architecture and mildly cellular fibromyxoid stroma
with minimal cytologic atypia. An increase in mitotic figures is not
identified. The differential diagnosis includes a fibroadenoma with
intracanalicular growth pattern and benign phyllodes tumor.
Malignancy is not identified in the material sampled. These findings
may not be representative of a larger lesion. Correlation with
clinical impression and radiographic findings is required.

Pathology results are CONCORDANT with imaging findings, per Dr.
SCHOLZ.

Pathology results and recommendations below were discussed with
patient via [employer] # [YF] by telephone on [DATE].
Patient reported biopsy site within normal limits with slight
tenderness at the site. Post biopsy care instructions were reviewed,
questions were answered and my direct phone number was provided to
patient. Patient was instructed to call [HOSPITAL] if
any concerns or questions arise related to the biopsy.

RECOMMENDATIONS:

1.  Site A - Surgical consultation for possible excision.

2. Sites B and C - Surgical consult for consideration of excision
verses follow-up six month diagnostic mammogram to ensure stability.
Request for surgical consultation relayed to SCHOLZ RN and
SCHOLZ RN at [HOSPITAL] [HOSPITAL] by SCHOLZ
RN on [DATE].

Pathology results reported by SCHOLZ RN on [DATE].



Site 1: [DATE] 4 cm from the nipple

Lesion quadrant: Upper inner quadrant

Using sterile technique and 1% lidocaine and 1% lidocaine with
epinephrine as local anesthetic, under direct ultrasound
visualization, a 14 gauge SCHOLZ device was used to perform
biopsy of a mass at [DATE] using a lateral approach. At the conclusion
of the procedure a HEART tissue marker clip was deployed into the
biopsy cavity. Follow up 2 view mammogram was performed and dictated
separately.

Site 2: 10 o'clock 3 cm from the nipple

Lesion quadrant: Upper outer quadrant

Using sterile technique and 1% lidocaine and 1% lidocaine with
epinephrine as local anesthetic, under direct ultrasound
visualization, a 14 gauge SCHOLZ device was used to perform
biopsy of a mass at 10 o'clock using a lateral approach. At the
conclusion of the procedure SCHOLZ tissue marker clip was deployed
into the biopsy cavity. Follow up 2 view mammogram was performed and
dictated separately.

Site 3: 10 o'clock 2 cm from the nipple

Lesion quadrant: Upper outer quadrant

Using sterile technique and 1% lidocaine and 1% lidocaine with
epinephrine as local anesthetic, under direct ultrasound
visualization, a 14 gauge SCHOLZ device was used to perform
biopsy of a mass at 10 o'clock using a lateral approach. At the
conclusion of the procedure a RIBBON tissue marker clip was deployed
into the biopsy cavity. Follow up 2 view mammogram was performed and
dictated separately.
IMPRESSION: Ultrasound guided biopsy of 3 RIGHT breast masses. No apparent
complications.

## 2021-05-03 LAB — SURGICAL PATHOLOGY

## 2021-05-03 NOTE — Progress Notes (Signed)
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  [facility] patient scheduled with Dr.  Dahlia Byes 05/13/21 at 2:30.  Explained [facility] will pay for consult.  If surgery recommended, told her [facility] may not pay for every cost involved with surgery.   Patient to call Jeanella Anton  after she sees surgeon to discuss recommendations.

## 2021-05-13 ENCOUNTER — Other Ambulatory Visit: Payer: Self-pay

## 2021-05-13 ENCOUNTER — Encounter: Payer: Self-pay | Admitting: Surgery

## 2021-05-13 ENCOUNTER — Ambulatory Visit (INDEPENDENT_AMBULATORY_CARE_PROVIDER_SITE_OTHER): Payer: Self-pay | Admitting: Surgery

## 2021-05-13 VITALS — BP 142/95 | HR 111 | Temp 98.4°F | Wt 174.2 lb

## 2021-05-13 DIAGNOSIS — N6312 Unspecified lump in the right breast, upper inner quadrant: Secondary | ICD-10-CM

## 2021-05-13 DIAGNOSIS — D4861 Neoplasm of uncertain behavior of right breast: Secondary | ICD-10-CM

## 2021-05-13 NOTE — Patient Instructions (Addendum)
Your breast MRI is scheduled for Tuesday Feb. 14, 2023 @ 3 pm (arrive by 2:30 pm) at [facility].   Our surgery scheduler Pamala Hurry will call you within 24-48 hours to get you scheduled. If you have not heard from her after 48 hours, please call our office. You will not need to get Covid tested before surgery and have the blue sheet available when she calls to write down important information.   If you have any concerns or questions, please feel free to call our office. See follow up appointment below.

## 2021-05-16 NOTE — Progress Notes (Signed)
Patient ID: Deanna Hall, female   DOB: November 01, 1975, 46 y.o.   MRN: [id number]  HPI Deanna Hall is a 46 y.o. female seen in consultation for a tender palpable mass located in the right breast.  She experiences intermittent breast pain for the last few months.  Pain is intermittent mild to moderate in nature and worsening with pressure from the bra. SHe has had prior breast biopsies on both sides back in [place].  Menarche at age 3, + breast feeding.  Has very irregular periods. She underwent bilateral mammograms followed by breast ultrasound.  There were 3 masses on the right breast most concerning 1 located at 130.  2 other masses located at 10:00. The masses were biopsied under ultrasound guidance. 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  The other 2 lesions were consistent with fibroadenoma versus Phyllodes. She is able to perform more than 4 METS of activity without any shortness of breath or chest pain.  CBC and CMP is normal.  HPI  Past Medical History:  Diagnosis Date   Asthma 2010   Depression    Diabetes mellitus without complication ([place])    Lump or mass in breast 2010    Past Surgical History:  Procedure Laterality Date   BIOPSY BREAST Right 05/02/2021   u/s bx x 3 heart 1:30 4cmfn, venus 10:00 3cmfn, ribbon 10:00 2cmfn   BREAST BIOPSY Right 2017   benign, s shape marker   BREAST BIOPSY Right 07/07/2017   path pending, coil shape   BREAST CYST EXCISION Bilateral 1999   neg   BREAST SURGERY Bilateral 2000   bilateral-cyst removed     History reviewed. No pertinent family history.  Social History Social History   Tobacco Use   Smoking status: Never   Smokeless tobacco: Never  Substance Use Topics   Alcohol use: No   Drug use: No    Allergies  Allergen Reactions   Naproxen Shortness Of Breath, Swelling, Rash and Anaphylaxis   Penicillins Hives    BREATHING PROBLEMS    Amoxicillin      BREATHING PROBLEMS    Augmentin [Amoxicillin-Pot Clavulanate]     BREATHING PROBLEMS    Ibuprofen     BREASTING PROBLEMS    Nsaids Swelling   Aspirin Rash    Current Outpatient Medications  Medication Sig Dispense Refill   atorvastatin (LIPITOR) 20 MG tablet Take 20 mg by mouth daily.     butalbital-acetaminophen-caffeine (FIORICET) 50-325-40 MG tablet Take by mouth.     cetirizine (ZYRTEC) 10 MG tablet Take 10 mg by mouth daily.     cyclobenzaprine (FLEXERIL) 5 MG tablet Take 5 mg by mouth 2 (two) times daily.     gabapentin (NEURONTIN) 100 MG capsule Take 100 mg by mouth 3 (three) times daily.     lisinopril (ZESTRIL) 2.5 MG tablet Take 2.5 mg by mouth daily.     metFORMIN (GLUCOPHAGE) 1000 MG tablet Take 1,000 mg by mouth 2 (two) times daily.     methocarbamol (ROBAXIN) 500 MG tablet Take 1 tablet (500 mg total) by mouth 4 (four) times daily. 16 tablet 0   sertraline (ZOLOFT) 100 MG tablet Take by mouth.     No current facility-administered medications for this visit.     Review of Systems Full ROS  was asked and was negative except for the information on the HPI  Physical Exam Blood pressure (!) 142/95, pulse (!) 111, temperature 98.4 F (36.9 C), temperature source Oral,  weight 174 lb 3.2 oz (79 kg), SpO2 99 %. CONSTITUTIONAL: NAD. EYES: Pupils are equal, round,  Sclera are non-icteric. EARS, NOSE, MOUTH AND THROAT: She is wearing a mask, Hearing is intact to voice. LYMPH NODES:  Lymph nodes in the neck are normal. RESPIRATORY:  Lungs are clear. There is normal respiratory effort, with equal breath sounds bilaterally, and without pathologic use of accessory muscles. CARDIOVASCULAR: Heart is regular without murmurs, gallops, or rubs. BREAST: There is evidence of a 3-1/2 x 2 cm mass located 1:00 on the right breast.  This is mobile and well-defined.  On the upper outer quadrant there is significant distortion as well induration with biopsy changes.  Difficult to really  assess for any masses. Nipple and skin are nml. No axillary LAD GI: The abdomen is NAD soft, nontender, and nondistended. There are no palpable masses. There is no hepatosplenomegaly. There are normal bowel sounds in all quadrants. GU: Rectal deferred.   MUSCULOSKELETAL: Normal muscle strength and tone. No cyanosis or edema.   SKIN: Turgor is good and there are no pathologic skin lesions or ulcers. NEUROLOGIC: Motor and sensation is grossly normal. Cranial nerves are grossly intact. PSYCH:  Oriented to person, place and time. Affect is normal.  Data Reviewed  I have personally reviewed the patient's imaging, laboratory findings and medical records.    Assessment/Plan 46 year old female with palpable lesion right breast upper inner quadrant.  On physical exam no clear cut I may say  that might be some inconsistencies from imaging studies to clinical exam.  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 And the feasibility of removal as a single specimen . From my perspective the mass located in the upper inner quadrant will need to be excised.  I would like to wait for further MRI images to determine the next steps regarding the other lesions. Please note that I spent greater than 45 minutes in this encounter including personally reviewing imaging studies, counseling the patient, placing orders and performing appropriate documentation   Caroleen Hamman, MD FACS General Surgeon 05/16/2021, 4:32 PM

## 2021-05-21 ENCOUNTER — Ambulatory Visit
Admission: RE | Admit: 2021-05-21 | Discharge: 2021-05-21 | Disposition: A | Discharge status: Home / Self Care | Payer: Self-pay | Source: Ambulatory Visit | Attending: Surgery | Admitting: Surgery

## 2021-05-21 DIAGNOSIS — D4861 Neoplasm of uncertain behavior of right breast: Secondary | ICD-10-CM | POA: Insufficient documentation

## 2021-05-21 IMAGING — MR MR BREAST BILAT WO/W CM
3 of 11 series · 11 of 48 positions shown · IV contrast (7.5ml Gadavist)
Comparison: Previous exam(s).

CLINICAL DATA: Recent right breast biopsies. Three lesions were
biopsied with pathology revealing fibroepithelial lesions.
Differential diagnosis includes phyllodes tumor.

EXAM:
BILATERAL BREAST MRI WITH AND WITHOUT CONTRAST
TECHNIQUE: Multiplanar, multisequence MR images of both breasts were obtained
prior to and following the intravenous administration of 7.5 ml of
Gadavist

[Series 2: T1 · axial · B · 1.5mm · 1.02mm/px · z∈[-86,+81]mm · 6 of 112 slices shown]
[im 1/112]
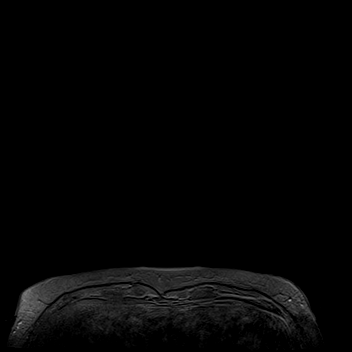
[im 23/112]
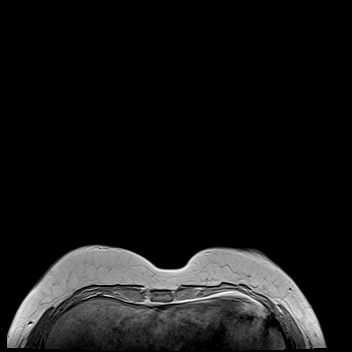
[im 45/112]
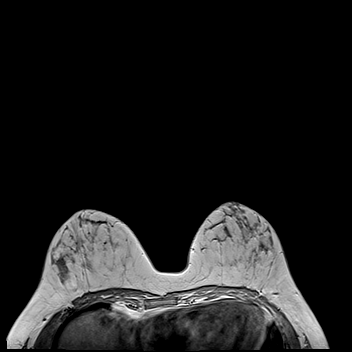
[im 67/112]
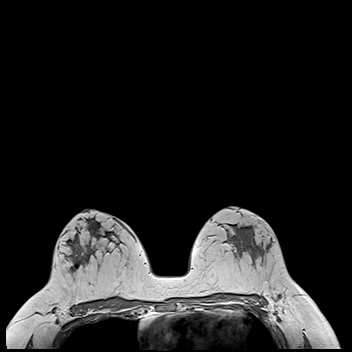
[im 89/112]
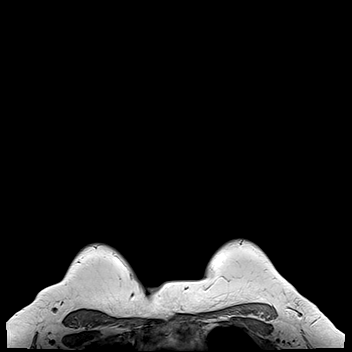
[im 112/112]
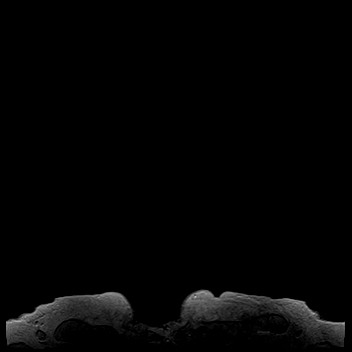

[Series 3: T2 · axial · B · 3.0mm · 1.02mm/px · z∈[-83,+79]mm · 3 of 45 slices shown (1 of 2)]
[im 1/45]
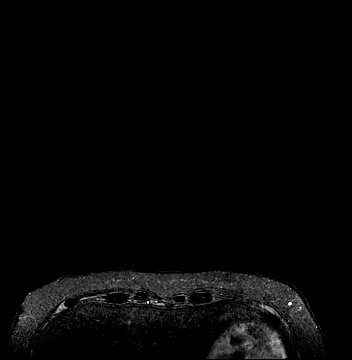
[im 23/45]
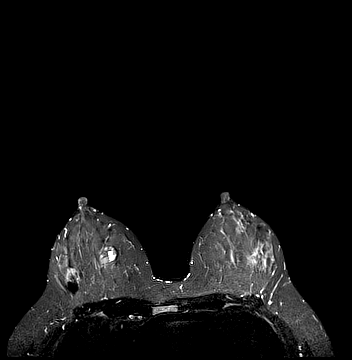
[im 45/45]
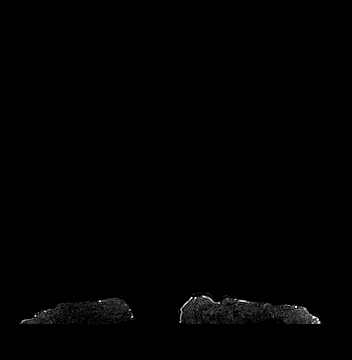

[Series 5: T2 · axial · B · 3.0mm · 1.02mm/px · z∈[-83,+79]mm · 2 of 45 slices shown (2 of 2)]
[im 1/45]
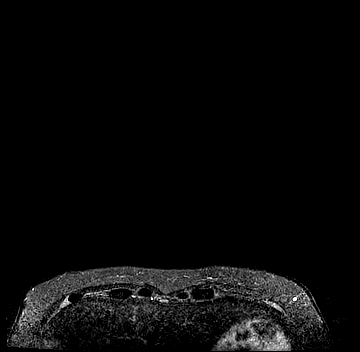
[im 45/45]
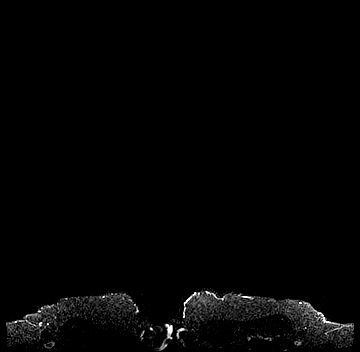

[11 of 48 positions shown; findings below may reference images not displayed]

Three-dimensional MR images were rendered by post-processing of the
original MR data on an independent workstation. The
three-dimensional MR images were interpreted, and findings are
reported in the following complete MRI report for this study. Three
dimensional images were evaluated at the independent interpreting
workstation using the DynaCAD thin client.
FINDINGS: Breast composition: b. Scattered fibroglandular tissue.

Background parenchymal enhancement: Marked

Right breast: There are several low level enhancing breast masses.

In the medial central breast, there is an oval progressively
enhancing mass measuring 1.8 x 1.3 x 1.5 cm, bright on T2,
containing biopsy clip susceptibility artifact reflecting the wing
shaped biopsy clip.

Near the same axial level, but more posteriorly and in the lateral
breast, there is a 9 mm progressively enhancing mass with
significant susceptibility artifact consistent with the closely
approximated coil and S shaped biopsy clips.

Anterior and slightly superior to this, there is a smaller
progressively enhancing mass measuring 8 mm, associated with
susceptibility artifact reflecting the venous shaped post biopsy
marking clip.

Anterior and slightly superior to the venous clip is a fourth
progressive enhancing mass, the smallest, 5 mm, with susceptibility
artifact consistent with the ribbon shaped post biopsy marking clip.

In the superior right breast, anterior to middle depth, there is an
oval enhancing mass with progressive enhancement kinetics, measuring
9 mm in size, partly hyperintense on T2 weighted imaging, centered
on image 73, series 11, with no associated susceptibility artifact.

There are no other right breast masses or areas of abnormal
enhancement.

Left breast: In the lower outer left breast, there is an enhancing
mass, centered on image 48, series 11, measuring 9 x 6 mm.
Enhancement kinetics are progressive.

In the retroareolar breast, upper outer quadrant, there is a
ring-enhancing mass, image 58, series 11, measuring 6 mm.

No other left breast masses or areas of abnormal enhancement.

Lymph nodes: No abnormal appearing lymph nodes.

Ancillary findings:  None.
IMPRESSION: 1. Multiple breast masses as detailed above.
2. Three of the right breast masses correspond to the
fibroepithelial lesions that were recently biopsied.
3. There is a similar appearing, 9 mm mass in the upper right
breast, anterior to the other masses, that has not been biopsied,
but is suspected to be similar pathology.
4. On the left, there are 2 low level, progressively enhancing
masses, 1 in the lower outer quadrant measuring 9 mm and 1 more
anteriorly, in the retroareolar upper outer quadrant, measuring 6
mm. This latter mass is ring-enhancing, differing in appearance from
the other masses.
5. No adenopathy.

RECOMMENDATION:
1. Recommend MRI guided biopsy of the ring-enhancing mass in the
left breast. The remaining 2 unbiopsied masses, the 9 mm mass in the
upper right breast and the 9 mm mass in the lower outer left breast,
have similar appearances and enhancement characteristics to the
biopsied masses and are also likely fibroepithelial lesions. The
ring enhancing mass on the left has a different
appearance/enhancement pattern.

BI-RADS CATEGORY  4: Suspicious.

## 2021-05-21 MED ORDER — GADOBUTROL 1 MMOL/ML IV SOLN
7.5000 mL | Freq: Once | INTRAVENOUS | Status: AC | PRN
  Administered 2021-05-21: 7.5 mL via INTRAVENOUS

## 2021-05-23 ENCOUNTER — Other Ambulatory Visit: Payer: Self-pay | Admitting: Surgery

## 2021-05-23 ENCOUNTER — Other Ambulatory Visit: Payer: Self-pay

## 2021-05-23 ENCOUNTER — Telehealth: Payer: Self-pay

## 2021-05-23 DIAGNOSIS — N6320 Unspecified lump in the left breast, unspecified quadrant: Secondary | ICD-10-CM

## 2021-05-23 NOTE — Telephone Encounter (Signed)
Interpreter services used for this call.  Spoke with interpreter Linton Rump.   MR guided biopsy left breast scheduled 05/27/21 @ 6:50 am. Nothing to eat/drink 4 hours prior.    [place] -after the biopsy she will go have a mammogram-location will be provided to patient after biopsy.   MR Guided Biopsy order placed per recommendation of MR report and Dr.Piscoya. Spoke with  [facility] @ [facility].   Scheduled follow up appointment with Dr.Pabon 06/03/21 @ 10:45am.

## 2021-05-27 ENCOUNTER — Ambulatory Visit
Admission: RE | Admit: 2021-05-27 | Discharge: 2021-05-27 | Disposition: A | Discharge status: Home / Self Care | Payer: Self-pay | Source: Ambulatory Visit | Attending: Surgery | Admitting: Surgery

## 2021-05-27 ENCOUNTER — Ambulatory Visit
Admission: RE | Admit: 2021-05-27 | Discharge: 2021-05-27 | Disposition: A | Discharge status: Home / Self Care | Payer: PRIVATE HEALTH INSURANCE | Source: Ambulatory Visit | Attending: Surgery | Admitting: Surgery

## 2021-05-27 ENCOUNTER — Other Ambulatory Visit: Payer: Self-pay

## 2021-05-27 DIAGNOSIS — N6320 Unspecified lump in the left breast, unspecified quadrant: Secondary | ICD-10-CM

## 2021-05-27 IMAGING — MR MR BREAST BX W LOC DEV 1ST LESION IMAGE BX SPEC MR GUIDE*L*
7 of 10 series · 33 of 48 positions shown · IV contrast (gadavist)
Comparison: Previous exams.
COMPARISON: Previous exams.

Addendum:
CLINICAL DATA: Patient with indeterminate enhancing mass within the
retroareolar left breast.

EXAM:
MRI GUIDED CORE NEEDLE BIOPSY OF THE LEFT BREAST
TECHNIQUE: Multiplanar, multisequence MR imaging of the left breast was
performed both before and after administration of intravenous
contrast.
CONTRAST:  8mL GADAVIST GADOBUTROL 1 MMOL/ML IV SOLN

[Series 2: fiducial unilateral · sagittal · 2.0mm · 1.33mm/px · 3 of 52 slices shown]
[im 1/52]
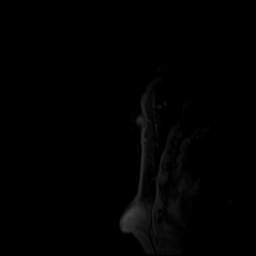
[im 26/52]
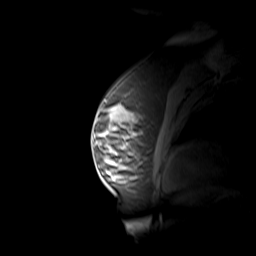
[im 52/52]
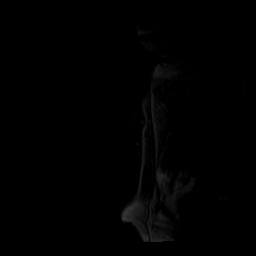

[Series 3: dynamic pre · axial · non-contrast · 1.3mm · 0.73mm/px · z∈[-74,+112]mm · 5 of 144 slices shown]
[im 1/144]
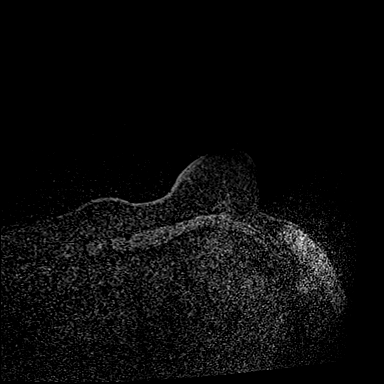
[im 36/144]
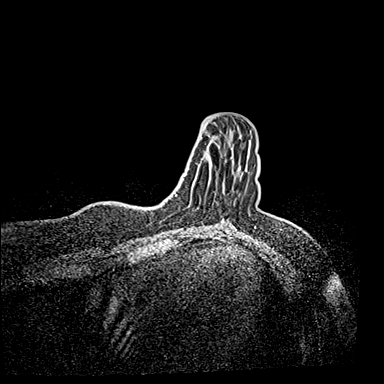
[im 72/144]
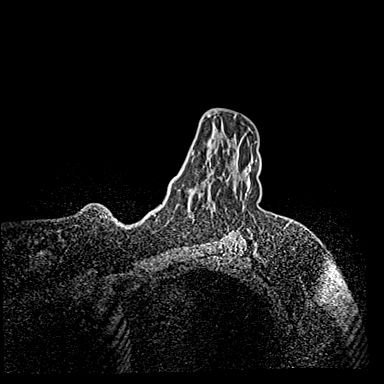
[im 108/144]
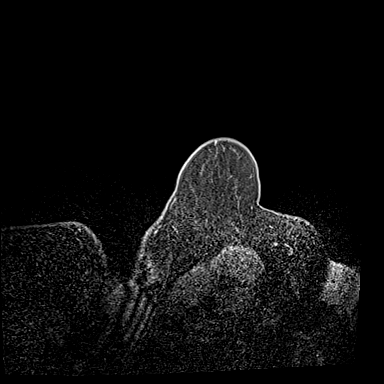
[im 144/144]
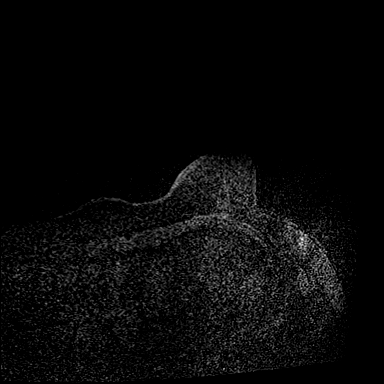

[Series 4: dynamic post 20 · axial · 1.3mm · 0.73mm/px · z∈[-74,+112]mm · 5 of 144 slices shown (1 of 2)]
[im 1/144]
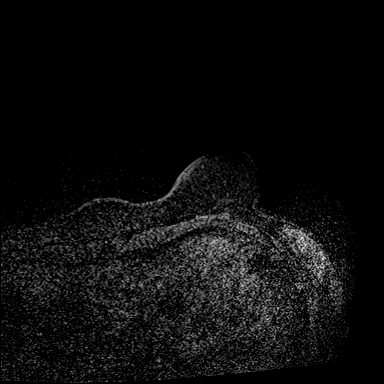
[im 36/144]
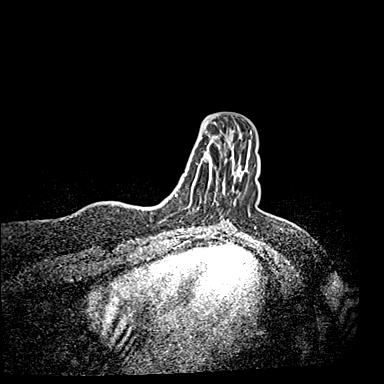
[im 72/144]
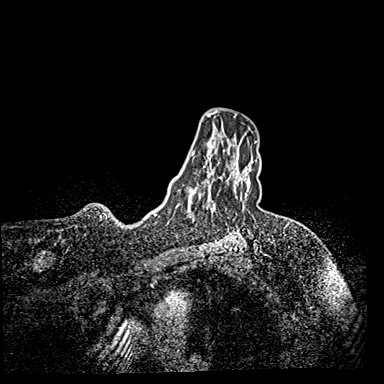
[im 108/144]
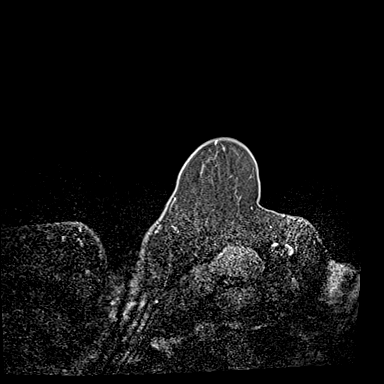
[im 144/144]
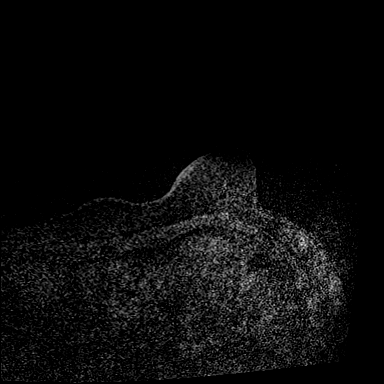

[Series 5: dynamic post 20 · axial · 1.3mm · 0.73mm/px · z∈[-74,+112]mm · 5 of 144 slices shown (2 of 2)]
[im 1/144]
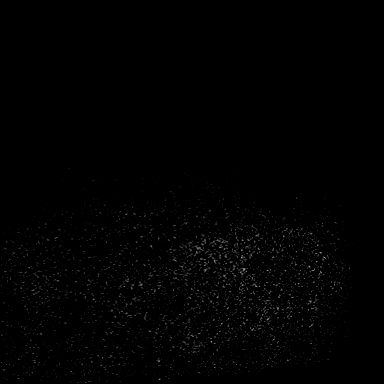
[im 36/144]
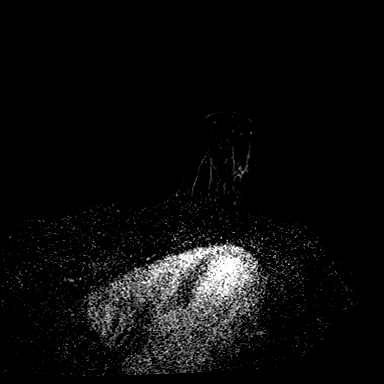
[im 72/144]
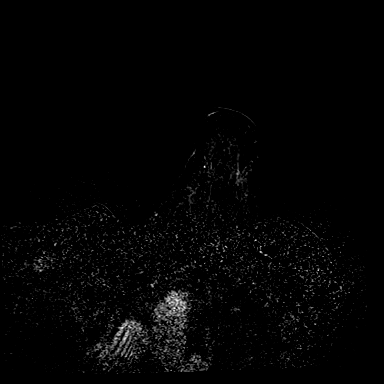
[im 108/144]
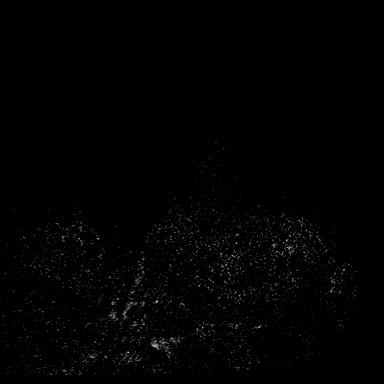
[im 144/144]
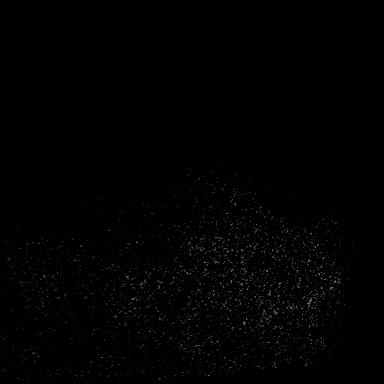

[Series 6: dynamic post 3 · axial · 1.3mm · 0.73mm/px · z∈[-74,+112]mm · 5 of 144 slices shown (1 of 2)]
[im 1/144]
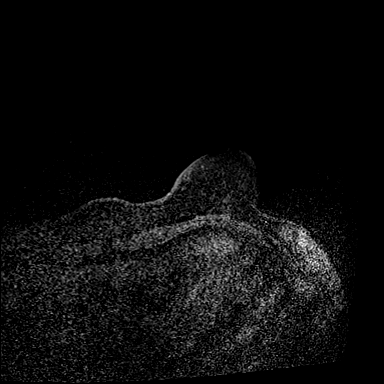
[im 36/144]
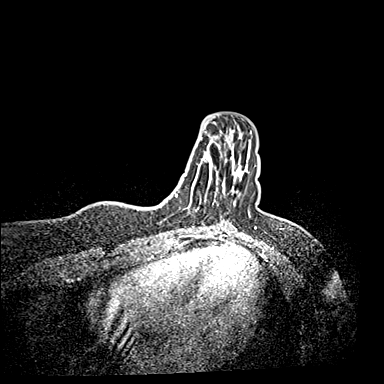
[im 72/144]
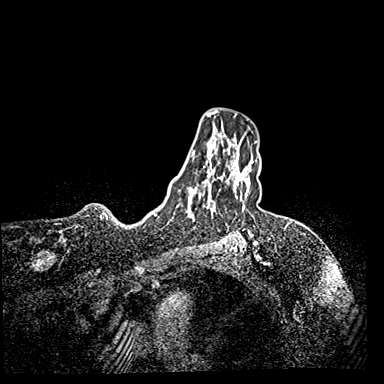
[im 108/144]
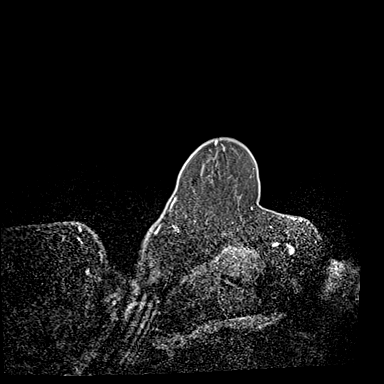
[im 144/144]
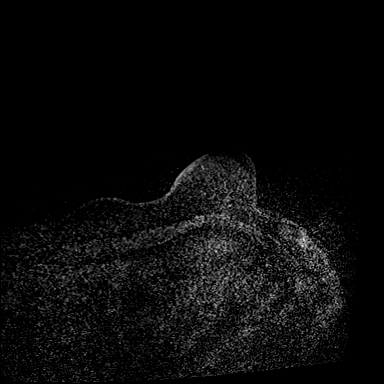

[Series 7: dynamic post 3 · axial · 1.3mm · 0.73mm/px · z∈[-74,+112]mm · 5 of 144 slices shown (2 of 2)]
[im 1/144]
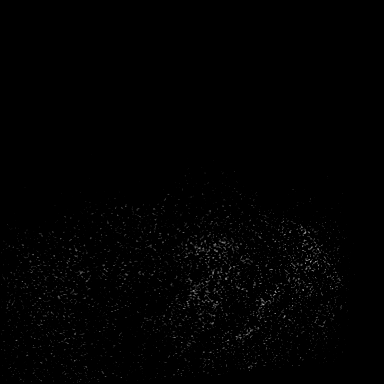
[im 36/144]
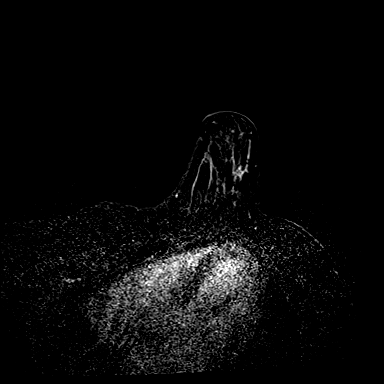
[im 72/144]
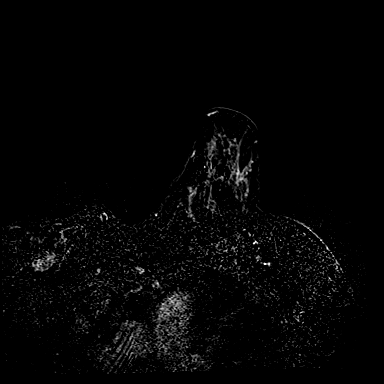
[im 108/144]
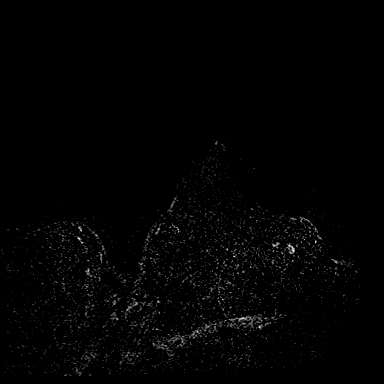
[im 144/144]
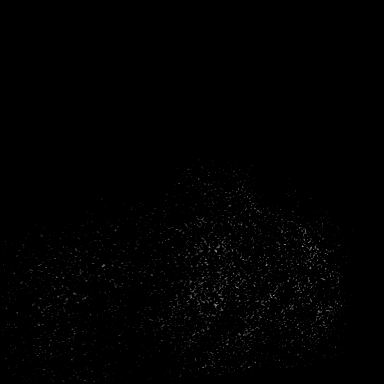

[Series 8: needle confirmation · axial · 1.3mm · 0.73mm/px · z∈[-74,+112]mm · 5 of 144 slices shown]
[im 1/144]
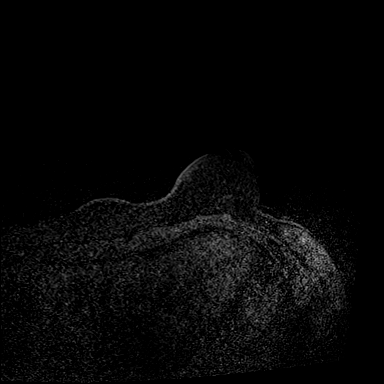
[im 36/144]
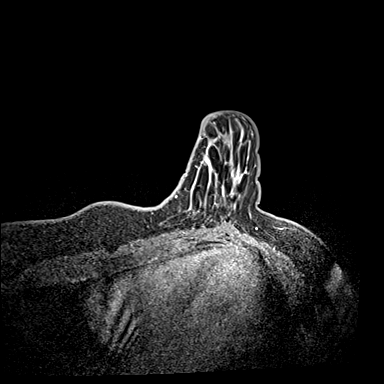
[im 72/144]
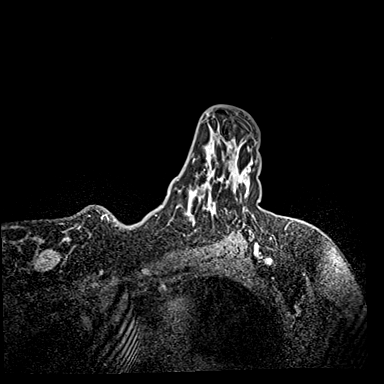
[im 108/144]
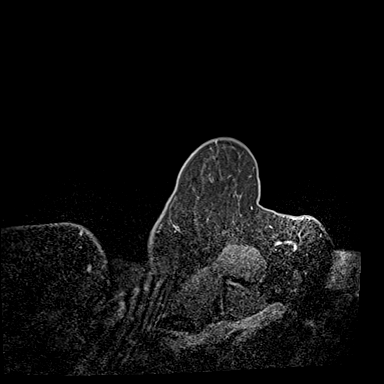
[im 144/144]
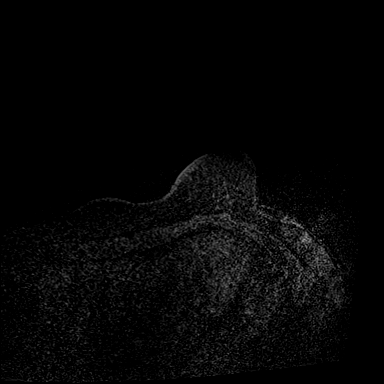

[33 of 48 positions shown; findings below may reference images not displayed]

FINDINGS: I met with the patient, and we discussed the procedure of MRI guided
biopsy, including risks, benefits, and alternatives. Specifically,
we discussed the risks of infection, bleeding, tissue injury, clip
migration, and inadequate sampling. Informed, written consent was
given. The usual time out protocol was performed immediately prior
to the procedure.

Using sterile technique, 1% Lidocaine, MRI guidance, and a 9 gauge
vacuum assisted device, biopsy was performed of retroareolar
enhancing mass left breast using a lateral approach. At the
conclusion of the procedure, a dumbbell shaped tissue marker clip
was deployed into the biopsy cavity. Follow-up 2-view mammogram was
performed and dictated separately.
IMPRESSION: MRI guided biopsy of retroareolar left breast mass. No apparent
complications.

ADDENDUM:
Pathology revealed FIBROADENOMA- NO EVIDENCE OF MALIGNANCY of the
LEFT breast, retroareolar (dumbbell-shaped clip). This was found to
be concordant by Dr. SOPIQOTI.

Pathology results were discussed with the patient by telephone with
patient reported doing well after the biopsy with tenderness at the
site. Post biopsy instructions and care were reviewed and questions
were answered. The patient was encouraged to call The [REDACTED]

Bilateral breast MRI recommended in 6 months per protocol.

Patient needs to continue with surgical consultation and care for
the other lesions previously biopsied within the right breast.

*** End of Addendum ***
FINDINGS: I met with the patient, and we discussed the procedure of MRI guided
biopsy, including risks, benefits, and alternatives. Specifically,
we discussed the risks of infection, bleeding, tissue injury, clip
migration, and inadequate sampling. Informed, written consent was
given. The usual time out protocol was performed immediately prior
to the procedure.

Using sterile technique, 1% Lidocaine, MRI guidance, and a 9 gauge
vacuum assisted device, biopsy was performed of retroareolar
enhancing mass left breast using a lateral approach. At the
conclusion of the procedure, a dumbbell shaped tissue marker clip
was deployed into the biopsy cavity. Follow-up 2-view mammogram was
performed and dictated separately.
IMPRESSION: MRI guided biopsy of retroareolar left breast mass. No apparent
complications.

## 2021-05-27 MED ORDER — GADOBUTROL 1 MMOL/ML IV SOLN
8.0000 mL | Freq: Once | INTRAVENOUS | Status: AC | PRN
  Administered 2021-05-27: 8 mL via INTRAVENOUS

## 2021-06-03 ENCOUNTER — Ambulatory Visit: Payer: Self-pay | Admitting: Surgery

## 2021-06-03 ENCOUNTER — Other Ambulatory Visit: Payer: Self-pay

## 2021-06-03 ENCOUNTER — Encounter: Payer: Self-pay | Admitting: Surgery

## 2021-06-03 ENCOUNTER — Ambulatory Visit (INDEPENDENT_AMBULATORY_CARE_PROVIDER_SITE_OTHER): Payer: Self-pay | Admitting: Surgery

## 2021-06-03 ENCOUNTER — Other Ambulatory Visit: Payer: Self-pay | Admitting: Surgery

## 2021-06-03 VITALS — BP 136/91 | HR 98 | Temp 98.8°F | Ht 64.0 in | Wt 176.2 lb

## 2021-06-03 DIAGNOSIS — R928 Other abnormal and inconclusive findings on diagnostic imaging of breast: Secondary | ICD-10-CM

## 2021-06-03 DIAGNOSIS — D241 Benign neoplasm of right breast: Secondary | ICD-10-CM

## 2021-06-03 NOTE — Patient Instructions (Signed)
Our surgery scheduler will call you within 24-48 hours to schedule your surgery. Please have the Pink surgery sheet available when speaking with her.

## 2021-06-04 ENCOUNTER — Telehealth: Payer: Self-pay | Admitting: Surgery

## 2021-06-04 ENCOUNTER — Other Ambulatory Visit: Payer: Self-pay | Admitting: Surgery

## 2021-06-04 DIAGNOSIS — R928 Other abnormal and inconclusive findings on diagnostic imaging of breast: Secondary | ICD-10-CM

## 2021-06-04 NOTE — Telephone Encounter (Signed)
Offered Spanish interpreter to the patient, but patient said no, she understands [place] well.   Patient has been advised of Pre-Admission date/time, COVID Testing date and Surgery date.  Surgery Date: 06/11/21 Preadmission Testing Date: 06/06/21 (phone 1p-5p) Covid Testing Date: Not needed.    Patient has been made aware to call [phone], between 1-3:00pm the day before surgery, to find out what time to arrive for surgery.    Patient also reminded of her RF tag to be placed at the [facility] on 06/05/21 arriving @3 :30 pm for 3:40 appt.   Patient verbalized understanding with all of the above.

## 2021-06-05 ENCOUNTER — Ambulatory Visit
Admission: RE | Admit: 2021-06-05 | Discharge: 2021-06-05 | Disposition: A | Discharge status: Home / Self Care | Payer: PRIVATE HEALTH INSURANCE | Source: Ambulatory Visit | Attending: Surgery | Admitting: Surgery

## 2021-06-05 ENCOUNTER — Other Ambulatory Visit: Payer: Self-pay | Admitting: Surgery

## 2021-06-05 ENCOUNTER — Other Ambulatory Visit: Payer: Self-pay

## 2021-06-05 ENCOUNTER — Inpatient Hospital Stay: Admission: RE | Admit: 2021-06-05 | Payer: PRIVATE HEALTH INSURANCE | Source: Ambulatory Visit

## 2021-06-05 DIAGNOSIS — R928 Other abnormal and inconclusive findings on diagnostic imaging of breast: Secondary | ICD-10-CM | POA: Insufficient documentation

## 2021-06-05 IMAGING — MG MM DIGITAL DIAGNOSTIC UNILAT*R* W/ TOMO W/ CAD
4 series · 4 of 12 positions shown · non-contrast
Comparison: Prior films

CLINICAL DATA: Status post ultrasound-guided RF device placement in
right breast 1:30 o'clock mass.

EXAM:
DIAGNOSTIC RIGHT MAMMOGRAM POST ULTRASOUND-GUIDED RF device
PLACEMENT

[R ML synth-2D]
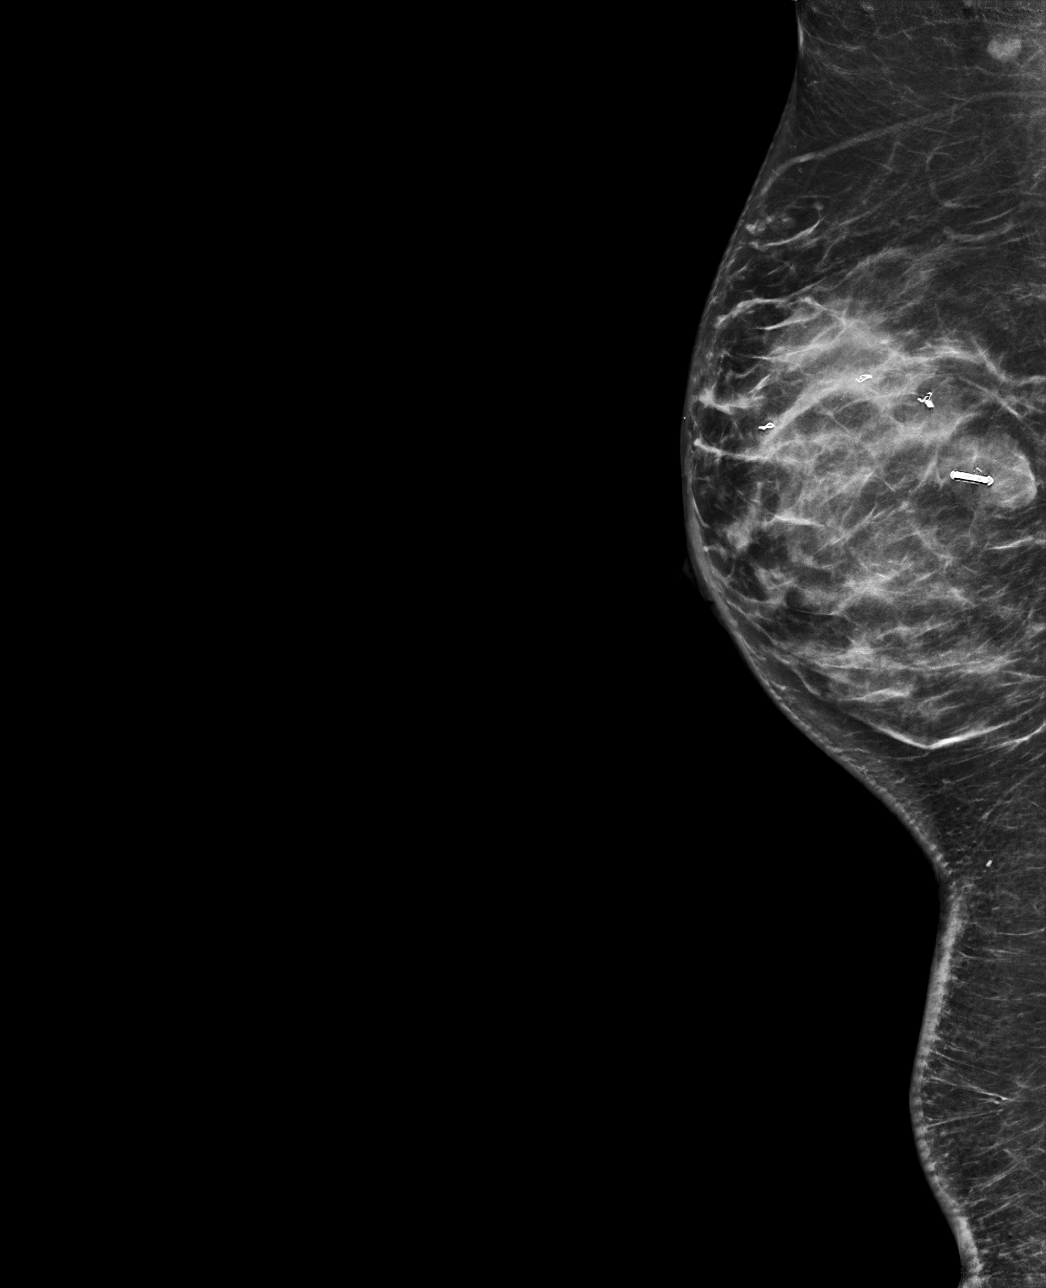

[R CC synth-2D]
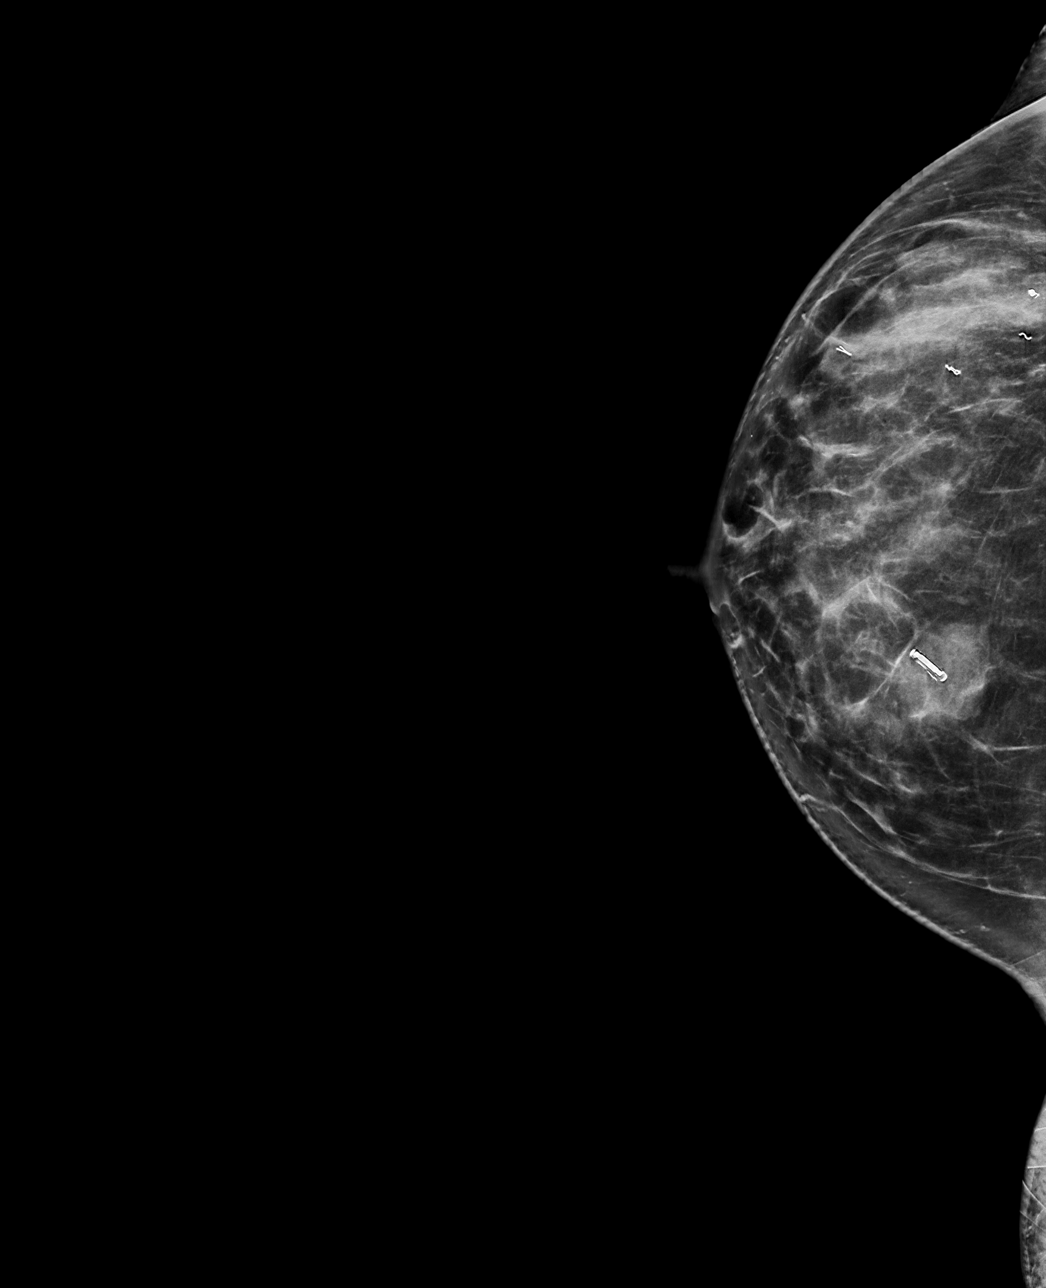

[R CC tomo · tomo slice 43/86.0]
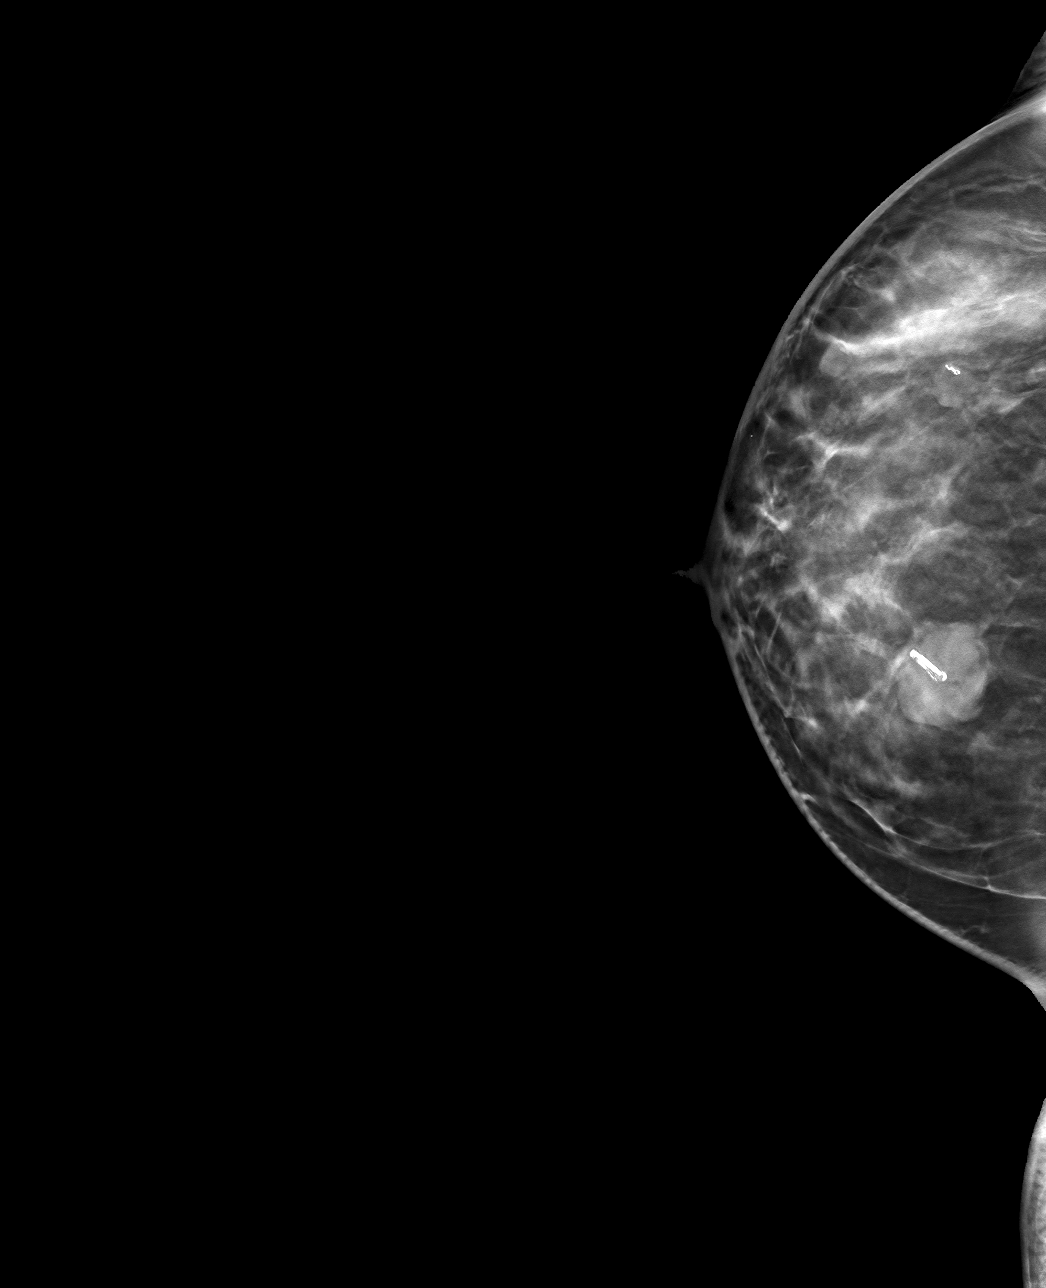

[R ML tomo · tomo slice 41/82.0]
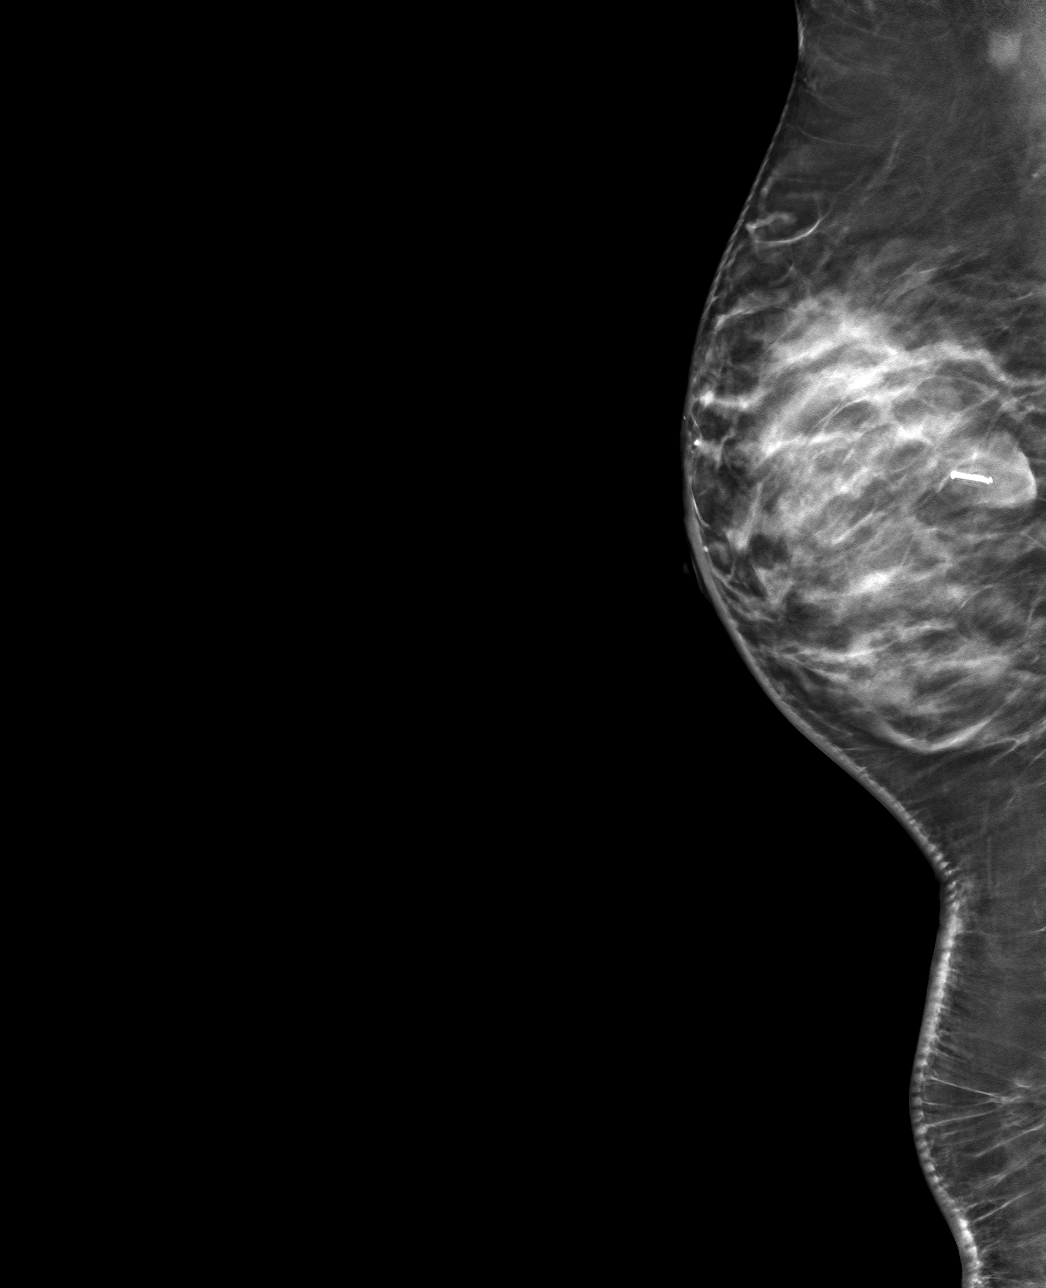

[4 of 12 positions shown; findings below may reference images not displayed]

FINDINGS: Mammographic images were obtained following ultrasound-guided RF
device placement. These demonstrate RF device in mass of concern
right breast 1:30 o'clock.
IMPRESSION: Appropriate location of the RF device.

Final Assessment: Post Procedure Mammograms for RF device placement

## 2021-06-05 IMAGING — US US PLC BREAST LOC DEV 1ST LEASION INC US GUIDE*R*
1 series · 3 of 3 positions shown · non-contrast
Comparison: Previous exam(s)

CLINICAL DATA: Right breast 1:30 o'clock fibroepithelial lesion
with minimal cytologic atypia for surgical excision

EXAM:
ULTRASOUND GUIDED RADIOFREQUENCY DEVICE
LOCALIZATION OF THE RIGHT BREAST

[Series 1: us plc breast loc dev 1st leasion inc us guide*rig · 0.07mm/px · 3 of 3 slices shown]
[im 1/3]
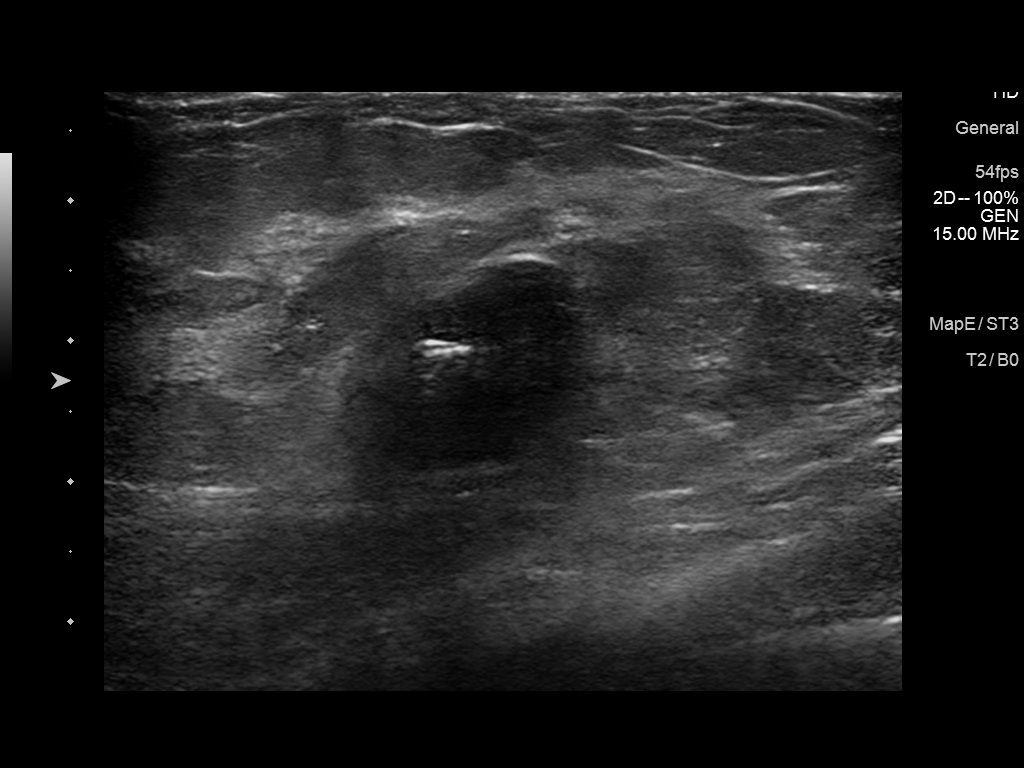
[im 2/3]
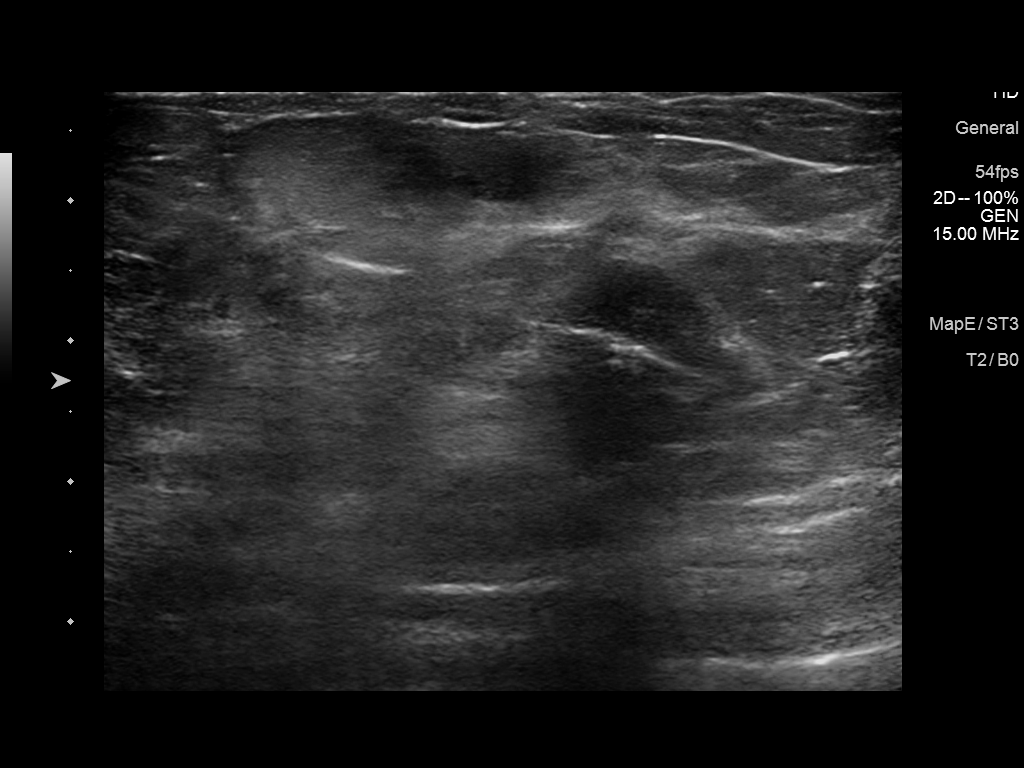
[im 3/3]
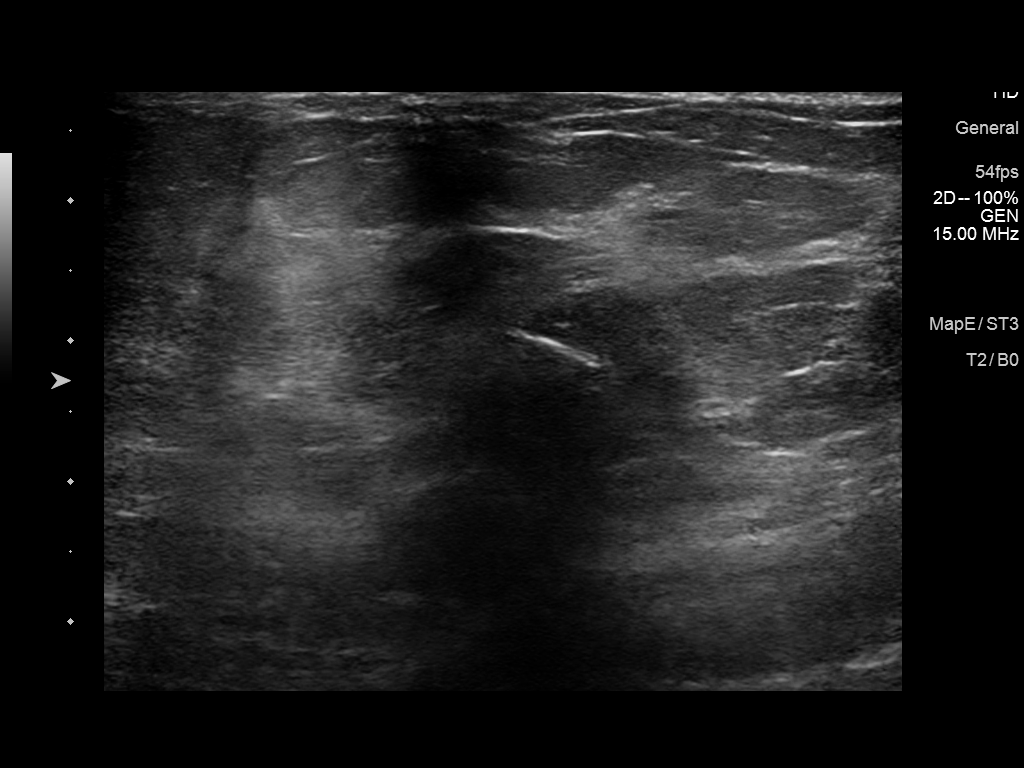

[3 of 3 positions shown; findings below may reference images not displayed]

FINDINGS: Patient presents for radiofrequency device localization prior to
RIGHT BREAST SURGERY. I met with the patient and we discussed the
procedure of radiofrequency device localization including benefits
and alternatives. We discussed the high likelihood of a successful
procedure. We discussed the risks of the procedure including
infection, bleeding, tissue injury and further surgery. Informed,
written consent was given.

The usual time-out protocol was performed immediately prior to the
procedure.

Using mammographic guidance, sterile technique, 1% lidocaine as
local anesthesia, a radiofrequency tag was used to localize mass at
right breast 1:30 o'clock using a lateral approach.

The follow-up mammogram images confirm that the RF device is in the
expected location and are marked for Dr. BRASS.

Follow-up survey of the patient confirms the presence of the RF
device.

The patient tolerated the procedure well and was released from the
[REDACTED].
IMPRESSION: Radiofrequency device localization of the right breast. No apparent
complications.

## 2021-06-05 NOTE — H&P (View-Only) (Signed)
Outpatient Surgical Follow Up  06/05/2021  Labrittany Macey Hall is an 46 y.o. female.   F/U MRI  HPI:  46 year old female with bilateral breast masses.  Symptomatic right breast mass located on the upper inner quadrant on her right breast.  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  She had 2 other lesions on the right breast.  Due to exam being a bit discordant with imaging studies I decided to perform an MRI.  MRI in fact another lesion in the right breast that was consistent with fibroadenoma.  More importantly found another lesion on the left breast that underwent MRI guided biopsy.  This was consistent with fibroadenoma/fibroepithelial lesion.  Please note that I have personally review all the imaging studies. She reports some left pain from the prior biopsy.  She still has a palpable lesion on the right upper inner quadrant that wishes to have surgically addressed.  Past Medical History:  Diagnosis Date   Asthma 2010   Depression    Diabetes mellitus without complication ([place])    Lump or mass in breast 2010    Past Surgical History:  Procedure Laterality Date   BIOPSY BREAST Right 05/02/2021   u/s bx x 3 heart 1:30 4cmfn, venus 10:00 3cmfn, ribbon 10:00 2cmfn   BREAST BIOPSY Right 2017   benign, s shape marker   BREAST BIOPSY Right 07/07/2017   path pending, coil shape   BREAST CYST EXCISION Bilateral 1999   neg   BREAST SURGERY Bilateral 2000   bilateral-cyst removed     History reviewed. No pertinent family history.  Social History:  reports that she has never smoked. She has never used smokeless tobacco. She reports that she does not drink alcohol and does not use drugs.  Allergies:  Allergies  Allergen Reactions   Naproxen Shortness Of Breath, Swelling, Rash and Anaphylaxis   Penicillins Hives    BREATHING PROBLEMS    Amoxicillin     BREATHING PROBLEMS    Augmentin [Amoxicillin-Pot Clavulanate]      BREATHING PROBLEMS    Ibuprofen     BREASTING PROBLEMS    Nsaids Swelling   Aspirin Rash    Medications reviewed.    ROS Full ROS performed and is otherwise negative other than what is stated in HPI   BP (!) 136/91    Pulse 98    Temp 98.8 F (37.1 C) (Oral)    Ht 5\' 4"  (1.626 m)    Wt 176 lb 3.2 oz (79.9 kg)    SpO2 96%    BMI 30.24 kg/m   Physical Exam  CONSTITUTIONAL: NAD. EYES: Pupils are equal, round,  Sclera are non-icteric. EARS, NOSE, MOUTH AND THROAT: She is wearing a mask, Hearing is intact to voice. LYMPH NODES:  Lymph nodes in the neck are normal. RESPIRATORY:  Lungs are clear. There is normal respiratory effort, with equal breath sounds bilaterally, and without pathologic use of accessory muscles. CARDIOVASCULAR: Heart is regular without murmurs, gallops, or rubs. BREAST: RIGHT:There is evidence of a 3-1/2 x 2 cm mass located 1:00 on the right breast.  This is mobile today exam is difficult to define the deeper margin.  On the upper outer quadrant of the right there is significant distortion as well induration with biopsy changes.  Difficult to really assess for any masses. Nipple and skin are nml. No axillary LAD LEft: no palpable masses, indurated brest due to recent biopsy, no infection GI: The abdomen is NAD soft, nontender, and nondistended.  There are no palpable masses. There is no hepatosplenomegaly. There are normal bowel sounds in all quadrants. GU: Rectal deferred.   MUSCULOSKELETAL: Normal muscle strength and tone. No cyanosis or edema.   SKIN: Turgor is good and there are no pathologic skin lesions or ulcers. NEUROLOGIC: Motor and sensation is grossly normal. Cranial nerves are grossly intact. PSYCH:  Oriented to person, place and time. Affect is normal.  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  Discussed with the patient in detail and she is in  agreement with lumpectomy of the right inner upper quadrant breast lesion.  We will perform this under RF tag guidance.  Procedure discussed with patient in detail.  Risks, benefits and possible complications including but not limited to, bleeding, infection, pathological upgrade of the benign lesion.  Potential reintervention.  Breast deformity and chronic pain.  She understands and wishes to proceed.  We also discussed that other lesions seem to be benign and less concerning at this time. Please note that I spent greater than 40 minutes's encounter including personally reviewing imaging studies, coordinating her care, placing orders and performing appropriate documentation  Caroleen Hamman, MD [place] Surgeon

## 2021-06-05 NOTE — Progress Notes (Signed)
Outpatient Surgical Follow Up  06/05/2021  Deanna Hall is an 46 y.o. female.   F/U MRI  HPI:  46 year old female with bilateral breast masses.  Symptomatic right breast mass located on the upper inner quadrant on her right breast.  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  She had 2 other lesions on the right breast.  Due to exam being a bit discordant with imaging studies I decided to perform an MRI.  MRI in fact another lesion in the right breast that was consistent with fibroadenoma.  More importantly found another lesion on the left breast that underwent MRI guided biopsy.  This was consistent with fibroadenoma/fibroepithelial lesion.  Please note that I have personally review all the imaging studies. She reports some left pain from the prior biopsy.  She still has a palpable lesion on the right upper inner quadrant that wishes to have surgically addressed.  Past Medical History:  Diagnosis Date   Asthma 2010   Depression    Diabetes mellitus without complication ([place])    Lump or mass in breast 2010    Past Surgical History:  Procedure Laterality Date   BIOPSY BREAST Right 05/02/2021   u/s bx x 3 heart 1:30 4cmfn, venus 10:00 3cmfn, ribbon 10:00 2cmfn   BREAST BIOPSY Right 2017   benign, s shape marker   BREAST BIOPSY Right 07/07/2017   path pending, coil shape   BREAST CYST EXCISION Bilateral 1999   neg   BREAST SURGERY Bilateral 2000   bilateral-cyst removed     History reviewed. No pertinent family history.  Social History:  reports that she has never smoked. She has never used smokeless tobacco. She reports that she does not drink alcohol and does not use drugs.  Allergies:  Allergies  Allergen Reactions   Naproxen Shortness Of Breath, Swelling, Rash and Anaphylaxis   Penicillins Hives    BREATHING PROBLEMS    Amoxicillin     BREATHING PROBLEMS    Augmentin [Amoxicillin-Pot Clavulanate]      BREATHING PROBLEMS    Ibuprofen     BREASTING PROBLEMS    Nsaids Swelling   Aspirin Rash    Medications reviewed.    ROS Full ROS performed and is otherwise negative other than what is stated in HPI   BP (!) 136/91    Pulse 98    Temp 98.8 F (37.1 C) (Oral)    Ht 5\' 4"  (1.626 m)    Wt 176 lb 3.2 oz (79.9 kg)    SpO2 96%    BMI 30.24 kg/m   Physical Exam  CONSTITUTIONAL: NAD. EYES: Pupils are equal, round,  Sclera are non-icteric. EARS, NOSE, MOUTH AND THROAT: She is wearing a mask, Hearing is intact to voice. LYMPH NODES:  Lymph nodes in the neck are normal. RESPIRATORY:  Lungs are clear. There is normal respiratory effort, with equal breath sounds bilaterally, and without pathologic use of accessory muscles. CARDIOVASCULAR: Heart is regular without murmurs, gallops, or rubs. BREAST: RIGHT:There is evidence of a 3-1/2 x 2 cm mass located 1:00 on the right breast.  This is mobile today exam is difficult to define the deeper margin.  On the upper outer quadrant of the right there is significant distortion as well induration with biopsy changes.  Difficult to really assess for any masses. Nipple and skin are nml. No axillary LAD LEft: no palpable masses, indurated brest due to recent biopsy, no infection GI: The abdomen is NAD soft, nontender, and nondistended.  There are no palpable masses. There is no hepatosplenomegaly. There are normal bowel sounds in all quadrants. GU: Rectal deferred.   MUSCULOSKELETAL: Normal muscle strength and tone. No cyanosis or edema.   SKIN: Turgor is good and there are no pathologic skin lesions or ulcers. NEUROLOGIC: Motor and sensation is grossly normal. Cranial nerves are grossly intact. PSYCH:  Oriented to person, place and time. Affect is normal.  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  Discussed with the patient in detail and she is in  agreement with lumpectomy of the right inner upper quadrant breast lesion.  We will perform this under RF tag guidance.  Procedure discussed with patient in detail.  Risks, benefits and possible complications including but not limited to, bleeding, infection, pathological upgrade of the benign lesion.  Potential reintervention.  Breast deformity and chronic pain.  She understands and wishes to proceed.  We also discussed that other lesions seem to be benign and less concerning at this time. Please note that I spent greater than 40 minutes's encounter including personally reviewing imaging studies, coordinating her care, placing orders and performing appropriate documentation  Caroleen Hamman, MD [facility] Surgeon

## 2021-06-06 ENCOUNTER — Encounter
Admission: RE | Admit: 2021-06-06 | Discharge: 2021-06-06 | Disposition: A | Discharge status: Home / Self Care | Payer: PRIVATE HEALTH INSURANCE | Source: Ambulatory Visit | Attending: Surgery | Admitting: Surgery

## 2021-06-06 DIAGNOSIS — Z01818 Encounter for other preprocedural examination: Secondary | ICD-10-CM

## 2021-06-06 DIAGNOSIS — E119 Type 2 diabetes mellitus without complications: Secondary | ICD-10-CM

## 2021-06-06 HISTORY — DX: Headache, unspecified: R51.9

## 2021-06-06 HISTORY — DX: Other complications of anesthesia, initial encounter: T88.59XA

## 2021-06-06 HISTORY — DX: Essential (primary) hypertension: I10

## 2021-06-06 HISTORY — DX: Gastro-esophageal reflux disease without esophagitis: K21.9

## 2021-06-06 NOTE — Patient Instructions (Addendum)
Su procedimiento est programado para: 06-11-21 Martes Presntese en el mostrador de registro en el primer piso del centro comercial mdico. Elveria Royals dirjase al mostrador de ciruga del segundo piso en el centro comercial mdico. Para averiguar su hora de llegada, llame al [phone] entre la 1:00 p. m. y las 3:00 p. m. el lunes 06-10-21  RECORDAR: Las instrucciones que no se siguen por completo Heritage manager en un riesgo mdico grave, que puede Psychologist, counselling; o segn el criterio de su cirujano y Environmental health practitioner, es posible que sea Firefighter su Leisure centre manager.  No coma alimentos despus de la medianoche de la noche anterior a la ciruga. No masticar chicle, pastillas o caramelos duros.  Sin embargo, puede beber [employer] 2 horas antes de la fecha prevista para la [place]. No beba nada dentro de las 2 horas de su hora de llegada programada.  Los diabticos tipo 1 y tipo 2 solo deben Conservation officer, historic buildings.  TOME ESTOS MEDICAMENTOS LA Evan DE LA CIRUGA CON UN SORBO DE AGUA: -gabapentina (NEURONTIN)  Suspender metFORMINA ([place]) 2 das antes de la ciruga-ltima dosis el sbado 06-08-21  Una semana antes de la ciruga: Deje de tomar antiinflamatorios (NSAIDS) como Advil, Aleve, Ibuprofen, Motrin, Naproxen, Naprosyn y productos a base de aspirina como Excedrin, Goodys Powder, [employer]. Sin embargo, puede tomar Tylenol si es necesario para Audiological scientist de la [place]. Moises Blood CUALQUIER suplemento/vitamina de Rogelia Rohrer AHORA (06-06-21) hasta despus de la [place].  No alcohol por 24 horas antes o despus de la [place].  No fumar, incluidos los cigarrillos electrnicos, durante las 24 horas previas a la [place]. No productos de tabaco masticables durante al menos 6 horas antes de la [place]. Sin parches de Special educational needs teacher de la [place].  No use ningn medicamento "recreativo" durante al menos una semana antes de su [place]. Tenga en cuenta que la combinacin de cocana y  anestesia puede tener resultados negativos, incluso la [place]. Si la prueba de Education officer, museum positivo, se cancelar la ciruga.  En la maana de la ciruga cepllese los dientes con pasta dental y agua, [place] enjuagarse la boca con enjuague bucal si lo desea. No trague ninguna pasta de dientes o enjuague bucal.  Use CHG Soap como se indica en la hoja de instrucciones.  No use joyas, maquillaje, horquillas para el cabello, clips o esmalte de uas.  No use lociones, talcos ni perfumes.  No se afeite el cuerpo desde el cuello hacia abajo 48 horas antes de la ciruga en caso de que se corte, lo que podra dejar un sitio para la infeccin. Adems, la piel recin afeitada puede irritarse si se [place] el jabn CHG.  No se pueden usar lentes de contacto, audfonos ni dentaduras postizas en la ciruga.  No lleve objetos de valor al hospital. [facility] no se responsabiliza por pertenencias u objetos de valor extraviados o perdidos.  Informe a su mdico si hay algn cambio en su condicin mdica (resfriado, fiebre, infeccin).  Use ropa cmoda (especfica para su tipo de [place]) al hospital.  Despus de la ciruga, puede ayudar a prevenir complicaciones pulmonares haciendo ejercicios de respiracin. Tome respiraciones profundas y tosa cada 1-2 horas. Su mdico puede ordenar un dispositivo llamado espirmetro de incentivo para ayudarlo a respirar profundamente. Al toser o estornudar, sostenga una almohada firmemente contra la incisin con ambas manos. Esto se llama entablillado. Hacer esto ayuda a proteger su incisin. [place] molestias abdominales.  Si lo internan en el  hospital durante la noche, deje su maleta en el automvil. Despus de la [place], es posible que lo lleven a su habitacin.  Si le dan de alta el da de la [place], no se le permitir conducir hasta su casa. Necesitar un adulto responsable (mayor de 18 aos) que lo lleve a su casa y se quede con usted esa  noche.  Si viaja en transporte pblico, deber ir acompaado de un adulto responsable (mayor de 18 aos). Confirme con su mdico que es aceptable usar el transporte pblico.  Llame al Maryln Manuel de Preadmisin al [phone] si tiene alguna pregunta sobre estas instrucciones.    Your procedure is scheduled on:06-11-21 Tuesday Report to the Registration Desk on the 1st floor of the [facility].Then proceed to the 2nd floor Surgery Desk in the Industry To find out your arrival time, please call [phone] between 1PM - 3PM on:06-10-21 Monday  REMEMBER: Instructions that are not followed completely may result in serious medical risk, up to and including death; or upon the discretion of your surgeon and anesthesiologist your surgery may need to be rescheduled.  Do not eat food after midnight the night before surgery.  No gum chewing, lozengers or hard candies.  You may however, drink Water up to 2 hours before you are scheduled to arrive for your surgery. Do not drink anything within 2 hours of your scheduled arrival time.  Type 1 and Type 2 diabetics should only drink water.  TAKE THESE MEDICATIONS THE MORNING OF SURGERY WITH A SIP OF WATER: -gabapentin (NEURONTIN)  Stop metFORMIN (GLUCOPHAGE) 2 days prior to surgery-Last dose on 06-08-21 Saturday  One week prior to surgery: Stop Anti-inflammatories (NSAIDS) such as Advil, Aleve, Ibuprofen, Motrin, Naproxen, Naprosyn and Aspirin based products such as Excedrin, Goodys Powder, BC Powder.You may however, take Tylenol if needed for pain up until the day of surgery.  Stop ANY OVER THE COUNTER supplements/vitamins NOW (06-06-21) until after surgery.  No Alcohol for 24 hours before or after surgery.  No Smoking including e-cigarettes for 24 hours prior to surgery.  No chewable tobacco products for at least 6 hours prior to surgery.  No nicotine patches on the day of surgery.  Do not use any "recreational" drugs for at  least a week prior to your surgery.  Please be advised that the combination of cocaine and anesthesia may have negative outcomes, up to and including death. If you test positive for cocaine, your surgery will be cancelled.  On the morning of surgery brush your teeth with toothpaste and water, you may rinse your mouth with mouthwash if you wish. Do not swallow any toothpaste or mouthwash.  Use CHG Soap as directed on instruction sheet.  Do not wear jewelry, make-up, hairpins, clips or nail polish.  Do not wear lotions, powders, or perfumes.   Do not shave body from the neck down 48 hours prior to surgery just in case you cut yourself which could leave a site for infection.  Also, freshly shaved skin may become irritated if using the CHG soap.  Contact lenses, hearing aids and dentures may not be worn into surgery.  Do not bring valuables to the hospital. [facility] is not responsible for any missing/lost belongings or valuables.   Notify your doctor if there is any change in your medical condition (cold, fever, infection).  Wear comfortable clothing (specific to your surgery type) to the hospital.  After surgery, you can help prevent lung complications by doing breathing exercises.  Take deep breaths and cough every 1-2 hours. Your doctor may order a device called an Incentive Spirometer to help you take deep breaths. When coughing or sneezing, hold a pillow firmly against your incision with both hands. This is called splinting. Doing this helps protect your incision. It also decreases belly discomfort.  If you are being admitted to the hospital overnight, leave your suitcase in the car. After surgery it may be brought to your room.  If you are being discharged the day of surgery, you will not be allowed to drive home. You will need a responsible adult (18 years or older) to drive you home and stay with you that night.   If you are taking public transportation, you will need to  have a responsible adult (18 years or older) with you. Please confirm with your physician that it is acceptable to use public transportation.   Please call the [facility] Dept. at [phone] if you have any questions about these instructions.  Surgery Visitation Policy:  Patients undergoing a surgery or procedure may have one family member or support person with them as long as that person is not COVID-19 positive or experiencing its symptoms.  That person may remain in the waiting area during the procedure and may rotate out with other people.  Inpatient Visitation:    Visiting hours are 7 a.m. to 8 p.m. Up to two visitors ages 16+ are allowed at one time in a patient room. The visitors may rotate out with other people during the day. Visitors must check out when they leave, or other visitors will not be allowed. One designated support person may remain overnight. The visitor must pass COVID-19 screenings, use hand sanitizer when entering and exiting the patients room and wear a mask at all times, including in the patients room. Patients must also wear a mask when staff or their visitor are in the room. Masking is required regardless of vaccination status.

## 2021-06-07 ENCOUNTER — Other Ambulatory Visit: Payer: Self-pay

## 2021-06-07 ENCOUNTER — Encounter
Admission: RE | Admit: 2021-06-07 | Discharge: 2021-06-07 | Disposition: A | Discharge status: Home / Self Care | Payer: PRIVATE HEALTH INSURANCE | Source: Ambulatory Visit | Attending: Surgery | Admitting: Surgery

## 2021-06-07 DIAGNOSIS — E119 Type 2 diabetes mellitus without complications: Secondary | ICD-10-CM | POA: Insufficient documentation

## 2021-06-07 DIAGNOSIS — I10 Essential (primary) hypertension: Secondary | ICD-10-CM | POA: Insufficient documentation

## 2021-06-07 LAB — BASIC METABOLIC PANEL
Anion gap: 10 (ref 5–15)
BUN: 13 mg/dL (ref 6–20)
CO2: 25 mmol/L (ref 22–32)
Calcium: 9.1 mg/dL (ref 8.9–10.3)
Chloride: 103 mmol/L (ref 98–111)
Creatinine, Ser: 0.56 mg/dL (ref 0.44–1.00)
GFR, Estimated: >60 mL/min (ref >60)
Glucose, Bld: 114 mg/dL — ABNORMAL HIGH (ref 70–99)
Potassium: 3.9 mmol/L (ref 3.5–5.1)
Sodium: 138 mmol/L (ref 135–145)

## 2021-06-10 ENCOUNTER — Ambulatory Visit: Payer: Self-pay | Admitting: Surgery

## 2021-06-11 ENCOUNTER — Ambulatory Visit
Admission: RE | Admit: 2021-06-11 | Discharge: 2021-06-11 | Disposition: A | Discharge status: Home / Self Care | Payer: Self-pay | Attending: Surgery | Admitting: Surgery

## 2021-06-11 ENCOUNTER — Encounter
Admission: RE | Disposition: A | Discharge status: Home / Self Care | Payer: Self-pay | Source: Home / Self Care | Attending: Surgery

## 2021-06-11 ENCOUNTER — Encounter: Payer: Self-pay | Admitting: Surgery

## 2021-06-11 ENCOUNTER — Other Ambulatory Visit: Payer: Self-pay

## 2021-06-11 ENCOUNTER — Ambulatory Visit: Payer: Self-pay | Admitting: Urgent Care

## 2021-06-11 ENCOUNTER — Ambulatory Visit: Payer: Self-pay | Admitting: Anesthesiology

## 2021-06-11 ENCOUNTER — Ambulatory Visit
Admission: RE | Admit: 2021-06-11 | Discharge: 2021-06-11 | Disposition: A | Discharge status: Home / Self Care | Payer: Self-pay | Source: Ambulatory Visit | Attending: Surgery | Admitting: Surgery

## 2021-06-11 DIAGNOSIS — R928 Other abnormal and inconclusive findings on diagnostic imaging of breast: Secondary | ICD-10-CM

## 2021-06-11 DIAGNOSIS — I10 Essential (primary) hypertension: Secondary | ICD-10-CM | POA: Insufficient documentation

## 2021-06-11 DIAGNOSIS — D241 Benign neoplasm of right breast: Secondary | ICD-10-CM | POA: Insufficient documentation

## 2021-06-11 DIAGNOSIS — K219 Gastro-esophageal reflux disease without esophagitis: Secondary | ICD-10-CM | POA: Insufficient documentation

## 2021-06-11 DIAGNOSIS — Z7984 Long term (current) use of oral hypoglycemic drugs: Secondary | ICD-10-CM | POA: Insufficient documentation

## 2021-06-11 DIAGNOSIS — F32A Depression, unspecified: Secondary | ICD-10-CM | POA: Insufficient documentation

## 2021-06-11 DIAGNOSIS — E119 Type 2 diabetes mellitus without complications: Secondary | ICD-10-CM | POA: Insufficient documentation

## 2021-06-11 DIAGNOSIS — J45909 Unspecified asthma, uncomplicated: Secondary | ICD-10-CM | POA: Insufficient documentation

## 2021-06-11 DIAGNOSIS — Z79899 Other long term (current) drug therapy: Secondary | ICD-10-CM | POA: Insufficient documentation

## 2021-06-11 HISTORY — PX: BREAST LUMPECTOMY WITH RADIOFREQUENCY TAG IDENTIFICATION: SHX6884

## 2021-06-11 LAB — GLUCOSE, CAPILLARY
Glucose-Capillary: 147 mg/dL — ABNORMAL HIGH (ref 70–99)
Glucose-Capillary: 220 mg/dL — ABNORMAL HIGH (ref 70–99)

## 2021-06-11 LAB — POCT PREGNANCY, URINE: Preg Test, Ur: NEGATIVE

## 2021-06-11 IMAGING — MG MM BREAST SURGICAL SPECIMEN
1 series · 1 of 1 positions shown · non-contrast
Comparison: Previous exam(s).

CLINICAL DATA: Status post RF device localized RIGHT breast
lumpectomy.

EXAM:
SPECIMEN RADIOGRAPH OF THE RIGHT BREAST

[R]
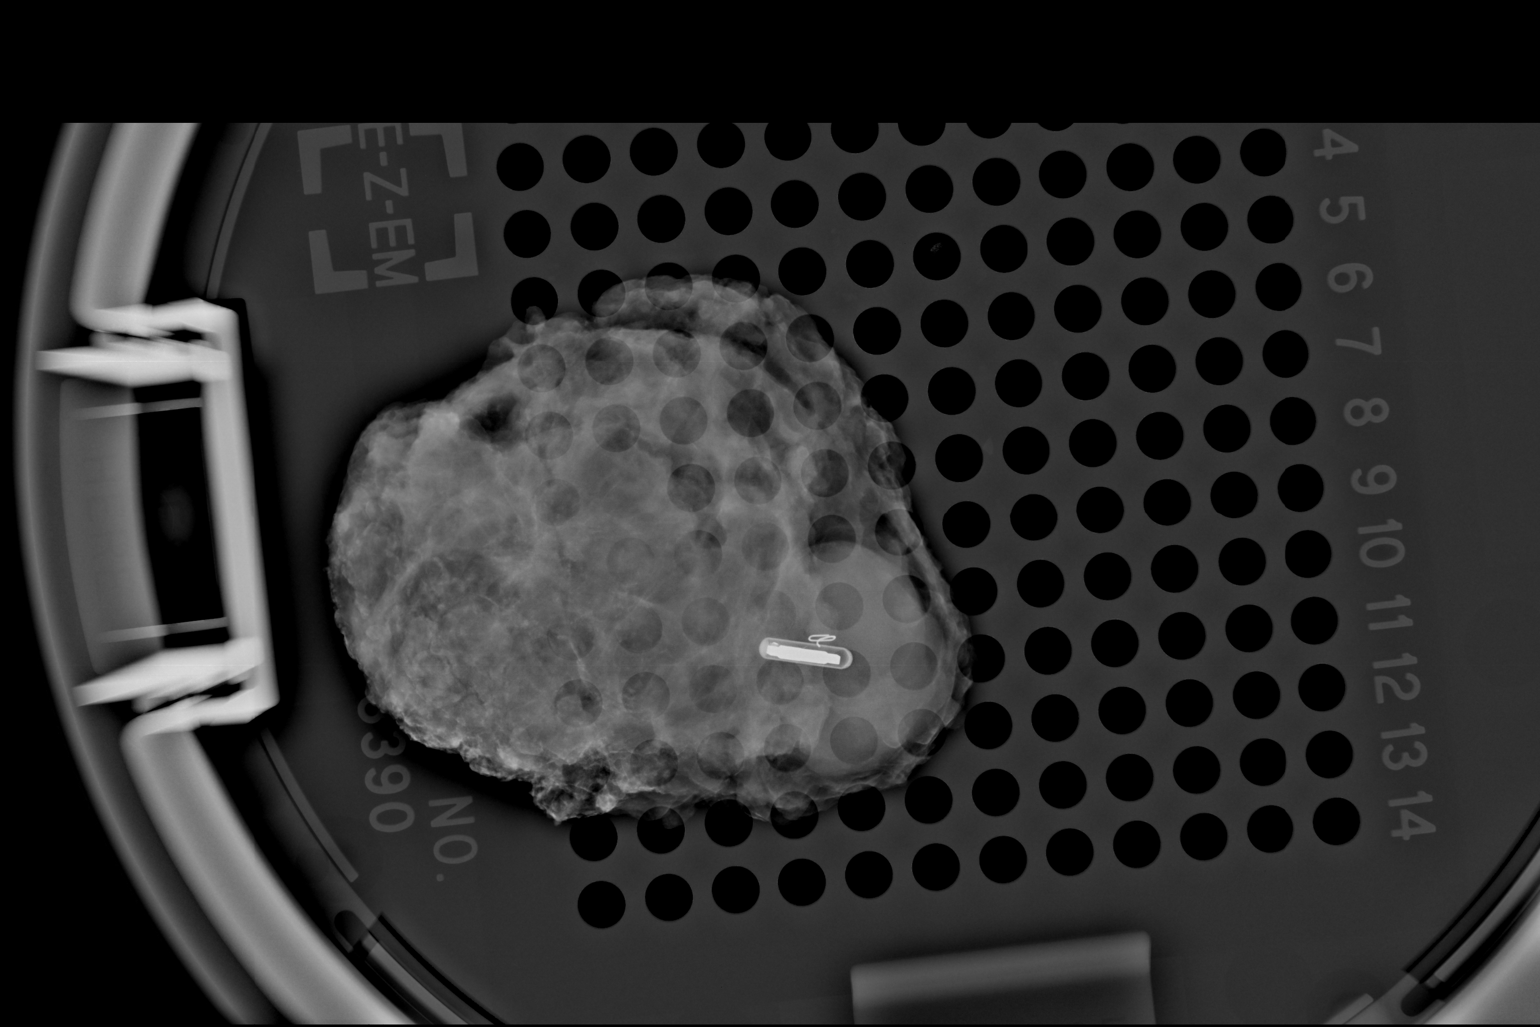

[1 of 1 positions shown; findings below may reference images not displayed]

FINDINGS: Status post excision of the RIGHT breast. The RF device and Heart
shaped clip are present within the specimen. I discussed the
findings with Dr. CB at the time of interpretation.
IMPRESSION: Specimen radiograph of the RIGHT breast.

## 2021-06-11 SURGERY — BREAST LUMPECTOMY WITH RADIOFREQUENCY TAG IDENTIFICATION
Anesthesia: General | Laterality: Right

## 2021-06-11 MED ORDER — DEXAMETHASONE SODIUM PHOSPHATE 10 MG/ML IJ SOLN
INTRAMUSCULAR | Status: DC | PRN
  Administered 2021-06-11: 10 mg via INTRAVENOUS

## 2021-06-11 MED ORDER — CHLORHEXIDINE GLUCONATE 0.12 % MT SOLN: 15.0000 mL | Freq: Once | OROMUCOSAL | Status: AC

## 2021-06-11 MED ORDER — OXYCODONE HCL 5 MG PO TABS
5.0000 mg | ORAL_TABLET | Freq: Once | ORAL | Status: AC
  Administered 2021-06-11: 5 mg via ORAL

## 2021-06-11 MED ORDER — GLYCOPYRROLATE 0.2 MG/ML IJ SOLN
INTRAMUSCULAR | Status: DC | PRN
Start: 2021-06-11 — End: 2021-06-11
  Administered 2021-06-11: .2 mg via INTRAVENOUS

## 2021-06-11 MED ORDER — FAMOTIDINE 20 MG PO TABS
ORAL_TABLET | ORAL | Status: AC
  Administered 2021-06-11: 20 mg via ORAL
  Filled 2021-06-11: qty 1

## 2021-06-11 MED ORDER — ONDANSETRON HCL 4 MG/2ML IJ SOLN: 4.0000 mg | Freq: Once | INTRAMUSCULAR | Status: DC | PRN

## 2021-06-11 MED ORDER — FAMOTIDINE 20 MG PO TABS: 20.0000 mg | ORAL_TABLET | Freq: Once | ORAL | Status: AC

## 2021-06-11 MED ORDER — LIDOCAINE HCL (CARDIAC) PF 100 MG/5ML IV SOSY
PREFILLED_SYRINGE | INTRAVENOUS | Status: DC | PRN
Start: 2021-06-11 — End: 2021-06-11
  Administered 2021-06-11: 80 mg via INTRAVENOUS

## 2021-06-11 MED ORDER — FENTANYL CITRATE (PF) 100 MCG/2ML IJ SOLN
INTRAMUSCULAR | Status: AC
  Administered 2021-06-11: 25 ug via INTRAVENOUS
  Filled 2021-06-11: qty 2

## 2021-06-11 MED ORDER — DEXMEDETOMIDINE (PRECEDEX) IN NS 20 MCG/5ML (4 MCG/ML) IV SYRINGE
PREFILLED_SYRINGE | INTRAVENOUS | Status: DC | PRN
  Administered 2021-06-11: 12 ug via INTRAVENOUS
  Administered 2021-06-11: 8 ug via INTRAVENOUS

## 2021-06-11 MED ORDER — STERILE WATER FOR IRRIGATION IR SOLN
Status: DC | PRN
Start: 2021-06-11 — End: 2021-06-11
  Administered 2021-06-11: 60 mL

## 2021-06-11 MED ORDER — CEFAZOLIN SODIUM-DEXTROSE 2-4 GM/100ML-% IV SOLN
INTRAVENOUS | Status: AC
  Filled 2021-06-11: qty 100

## 2021-06-11 MED ORDER — MIDAZOLAM HCL 2 MG/2ML IJ SOLN
INTRAMUSCULAR | Status: AC
  Filled 2021-06-11: qty 2

## 2021-06-11 MED ORDER — CEFAZOLIN SODIUM-DEXTROSE 2-4 GM/100ML-% IV SOLN
2.0000 g | INTRAVENOUS | Status: AC
  Administered 2021-06-11: 2 g via INTRAVENOUS

## 2021-06-11 MED ORDER — PHENYLEPHRINE HCL (PRESSORS) 10 MG/ML IV SOLN
INTRAVENOUS | Status: AC
  Filled 2021-06-11: qty 1

## 2021-06-11 MED ORDER — OXYCODONE HCL 5 MG PO TABS
ORAL_TABLET | ORAL | Status: AC
  Filled 2021-06-11: qty 1

## 2021-06-11 MED ORDER — HYDROCODONE-ACETAMINOPHEN 5-325 MG PO TABS: 1.0000 | ORAL_TABLET | ORAL | 0 refills | Status: DC | PRN

## 2021-06-11 MED ORDER — MIDAZOLAM HCL 2 MG/2ML IJ SOLN
INTRAMUSCULAR | Status: DC | PRN
  Administered 2021-06-11: 2 mg via INTRAVENOUS

## 2021-06-11 MED ORDER — ACETAMINOPHEN 500 MG PO TABS
ORAL_TABLET | ORAL | Status: AC
  Administered 2021-06-11: 1000 mg via ORAL
  Filled 2021-06-11: qty 2

## 2021-06-11 MED ORDER — ORAL CARE MOUTH RINSE: 15.0000 mL | Freq: Once | OROMUCOSAL | Status: AC

## 2021-06-11 MED ORDER — CHLORHEXIDINE GLUCONATE CLOTH 2 % EX PADS: 6.0000 | MEDICATED_PAD | Freq: Once | CUTANEOUS | Status: DC

## 2021-06-11 MED ORDER — GABAPENTIN 300 MG PO CAPS
ORAL_CAPSULE | ORAL | Status: AC
  Administered 2021-06-11: 300 mg via ORAL
  Filled 2021-06-11: qty 1

## 2021-06-11 MED ORDER — CHLORHEXIDINE GLUCONATE 0.12 % MT SOLN
OROMUCOSAL | Status: AC
  Administered 2021-06-11: 15 mL via OROMUCOSAL
  Filled 2021-06-11: qty 15

## 2021-06-11 MED ORDER — BUPIVACAINE-EPINEPHRINE 0.25% -1:200000 IJ SOLN
INTRAMUSCULAR | Status: DC | PRN
  Administered 2021-06-11: 30 mL

## 2021-06-11 MED ORDER — FENTANYL CITRATE (PF) 100 MCG/2ML IJ SOLN
INTRAMUSCULAR | Status: AC
  Filled 2021-06-11: qty 2

## 2021-06-11 MED ORDER — SODIUM CHLORIDE 0.9 % IV SOLN: INTRAVENOUS | Status: DC

## 2021-06-11 MED ORDER — PHENYLEPHRINE HCL (PRESSORS) 10 MG/ML IV SOLN
INTRAVENOUS | Status: DC | PRN
Start: 2021-06-11 — End: 2021-06-11
  Administered 2021-06-11 (×2): 100 ug via INTRAVENOUS

## 2021-06-11 MED ORDER — HYDROMORPHONE HCL 1 MG/ML IJ SOLN
0.2500 mg | INTRAMUSCULAR | Status: DC | PRN
  Administered 2021-06-11 (×3): 0.25 mg via INTRAVENOUS

## 2021-06-11 MED ORDER — FENTANYL CITRATE (PF) 100 MCG/2ML IJ SOLN
INTRAMUSCULAR | Status: DC | PRN
  Administered 2021-06-11 (×4): 50 ug via INTRAVENOUS

## 2021-06-11 MED ORDER — HYDROMORPHONE HCL 1 MG/ML IJ SOLN
INTRAMUSCULAR | Status: AC
  Administered 2021-06-11: 0.25 mg via INTRAVENOUS
  Filled 2021-06-11: qty 1

## 2021-06-11 MED ORDER — GABAPENTIN 300 MG PO CAPS: 300.0000 mg | ORAL_CAPSULE | ORAL | Status: AC

## 2021-06-11 MED ORDER — FENTANYL CITRATE (PF) 100 MCG/2ML IJ SOLN
25.0000 ug | INTRAMUSCULAR | Status: AC | PRN
  Administered 2021-06-11 (×6): 25 ug via INTRAVENOUS

## 2021-06-11 MED ORDER — ONDANSETRON HCL 4 MG/2ML IJ SOLN
INTRAMUSCULAR | Status: DC | PRN
  Administered 2021-06-11: 4 mg via INTRAVENOUS

## 2021-06-11 MED ORDER — PROPOFOL 10 MG/ML IV BOLUS
INTRAVENOUS | Status: DC | PRN
Start: 2021-06-11 — End: 2021-06-11
  Administered 2021-06-11: 180 mg via INTRAVENOUS

## 2021-06-11 MED ORDER — ACETAMINOPHEN 500 MG PO TABS: 1000.0000 mg | ORAL_TABLET | ORAL | Status: AC

## 2021-06-11 MED ORDER — PROPOFOL 500 MG/50ML IV EMUL
INTRAVENOUS | Status: AC
  Filled 2021-06-11: qty 50

## 2021-06-11 MED ORDER — BUPIVACAINE-EPINEPHRINE (PF) 0.25% -1:200000 IJ SOLN
INTRAMUSCULAR | Status: AC
  Filled 2021-06-11: qty 30

## 2021-06-11 SURGICAL SUPPLY — 33 items
APPLICATOR CHLORAPREP 10 TEAL (MISCELLANEOUS) ×1 IMPLANT
APPLIER CLIP 9.375 SM OPEN (CLIP)
CLIP APPLIE 9.375 SM OPEN (CLIP) IMPLANT
DERMABOND ADVANCED (GAUZE/BANDAGES/DRESSINGS) ×1
DERMABOND ADVANCED .7 DNX12 (GAUZE/BANDAGES/DRESSINGS) ×1 IMPLANT
DEVICE DUBIN SPECIMEN MAMMOGRA (MISCELLANEOUS) ×2 IMPLANT
DRAPE CHEST BREAST 77X106 FENE (MISCELLANEOUS) ×2 IMPLANT
ELECT CAUTERY BLADE 6.4 (BLADE) ×2 IMPLANT
ELECT REM PT RETURN 9FT ADLT (ELECTROSURGICAL) ×2
ELECTRODE REM PT RTRN 9FT ADLT (ELECTROSURGICAL) ×1 IMPLANT
GLOVE SURG ENC MOIS LTX SZ7 (GLOVE) ×6 IMPLANT
GOWN STRL REUS W/ TWL LRG LVL3 (GOWN DISPOSABLE) ×2 IMPLANT
GOWN STRL REUS W/TWL LRG LVL3 (GOWN DISPOSABLE) ×3
KIT MARKER MARGIN INK (KITS) IMPLANT
KIT TURNOVER KIT A (KITS) ×2 IMPLANT
MANIFOLD NEPTUNE II (INSTRUMENTS) ×2 IMPLANT
MARGIN MAP 10MM (MISCELLANEOUS) IMPLANT
MARKER MARGIN CORRECT CLIP (MARKER) IMPLANT
NEEDLE HYPO 22GX1.5 SAFETY (NEEDLE) ×2 IMPLANT
PACK BASIN MINOR ARMC (MISCELLANEOUS) ×2 IMPLANT
SET LOCALIZER 20 PROBE US (MISCELLANEOUS) ×2 IMPLANT
SPONGE T-LAP 18X18 ~~LOC~~+RFID (SPONGE) ×3 IMPLANT
SUT MNCRL 4-0 (SUTURE) ×2
SUT MNCRL 4-0 27XMFL (SUTURE) ×2
SUT SILK 2 0 SH (SUTURE) IMPLANT
SUT VIC AB 2-0 SH 27 (SUTURE) ×4
SUT VIC AB 2-0 SH 27XBRD (SUTURE) ×2 IMPLANT
SUT VIC AB 3-0 SH 27 (SUTURE) ×3
SUT VIC AB 3-0 SH 27X BRD (SUTURE) ×2 IMPLANT
SUTURE MNCRL 4-0 27XMF (SUTURE) ×2 IMPLANT
TRAP NEPTUNE SPECIMEN COLLECT (MISCELLANEOUS) ×2 IMPLANT
WATER STERILE IRR 1000ML POUR (IV SOLUTION) ×2 IMPLANT
WATER STERILE IRR 500ML POUR (IV SOLUTION) ×2 IMPLANT

## 2021-06-11 NOTE — Discharge Instructions (Addendum)
Tumorectom?a, cuidados posteriores ?Lumpectomy, Care After ?Esta hoja le brinda informaci?n sobre c?mo cuidarse despu?s del procedimiento. Su m?dico tambi?n podr? darle instrucciones m?s espec?ficas. Comun?quese con el m?dico si tiene problemas o preguntas. ??Qu? puedo esperar despu?s del procedimiento? ?Despu?s del procedimiento, es com?n tener los siguientes s?ntomas: ?Hinchaz?n de las mamas. ?Dolor a Theatre manager. ?Rigidez en el brazo o el hombro. ?Cambio en la forma y la sensibilidad de las [place]. ?Tejido cicatricial que se siente duro al tacto en la zona donde se extrajo el n?dulo. ?Siga estas instrucciones en su casa: ?Medicamentos ?Tome los medicamentos de venta libre y los recetados solamente como se lo haya indicado el m?dico. ?Si le recetaron un antibi?tico, t?melo como se lo haya indicado el m?dico. No deje de tomar los antibi?ticos aunque comience a sentirse mejor. ?Preg?ntele al m?dico si el medicamento recetado: ?Hace necesario que evite conducir o usar maquinaria pesada. ?Puede causarle estre?imiento. Es posible que tenga que tomar estas medidas para prevenir o tratar el estre?imiento: ?Beba suficiente l?quido como para mantener la orina de color amarillo p?lido. ?Tome medicamentos recetados o de venta [place]. ?Consuma alimentos ricos en fibra, como frijoles, cereales integrales, y frutas y verduras frescas. ?Limite el consumo de alimentos ricos en grasa y az?cares procesados, como alimentos fritos o dulces. ?Cuidados de la incisi?n ?  ? ?Siga las instrucciones del m?dico acerca del cuidado de la incisi?n. Aseg?rese de hacer lo siguiente: ?L?vese las manos con agua y jab?n antes y despu?s de cambiar la venda (vendaje). Use desinfectante para manos si no dispone de agua y jab?n. ?Cambie el vendaje como se lo haya indicado el m?dico. ?No retire los puntos (suturas), la goma para cerrar la piel o las tiras [place]. Es posible que estos cierres cut?neos deban quedar puestos en la piel  durante 2 semanas o m?s tiempo. Si los bordes de las tiras Raelyn Number empiezan a despegarse y Therapist, sports, puede recortar los que est?n sueltos. No retire las tiras [facility] por completo a menos que el m?dico se lo indique. ?Controle la zona de la incisi?n todos los d?as para Hydrographic surveyor signos de infecci?n. Est? atenta a los siguientes signos: ?Aumento del enrojecimiento, la hinchaz?n o Conservation officer, historic buildings. ?L?quido o sangre. ?[place]. ?Pus o mal olor. ?Mantenga el vendaje limpio y seco. ?Si la enviaron de regreso a su casa con un drenaje quir?rgico colocado, siga las indicaciones del m?dico sobre c?mo vaciarlo. ?Ba?os ?No tome ba?os de inmersi?n, no nade ni use el jacuzzi hasta que el m?dico la autorice. ?Preg?ntele al m?dico si puede ducharse. Thurston Pounds solo le permitan darse ba?os de [place]. ?Actividad ?Haga reposo como se lo haya indicado el m?dico. ?Evite estar sentada durante largos per?odos sin moverse. Lev?ntese y camine un poco cada 1 a 2 horas. Esto es importante para mejorar el flujo sangu?neo y la respiraci?n. Pida ayuda si se siente d?bil o inestable. ?Retome sus actividades normales seg?n lo indicado por el m?dico. Preg?ntele al m?dico qu? actividades son seguras para usted. ?Evite cualquier actividad que pueda causarle una lesi?n en el brazo que est? del lado de la cirug?a. ?No levante ning?n objeto que pese m?s de 10 libras (4,5 kg) o el l?mite de [employer] hayan indicado, hasta que el m?dico le diga que puede [place]. Evite levantar objetos con el brazo que est? del lado de la cirug?a. ?No cargue objetos pesados sobre el hombro del lado de la cirug?a. ?Haga ejercicios para evitar que el hombro y el brazo se pongan r?gidos y se hinchen.  Consulte al m?dico sobre los tipos de ejercicios que son seguros para usted. ?Instrucciones generales ?Use un sost?n de soporte como se lo haya indicado su m?dico. ?Cuando est? sentada o acostada, levante (eleve) el brazo por encima del nivel del coraz?n. ?No use anillos, pulseras  ni otros accesorios ajustados en el brazo, la mu?eca o los dedos del lado de la cirug?a. ?Concurra a todas las visitas de seguimiento como se lo haya indicado el m?dico. Esto es importante. ?Probablemente necesite que le hagan un estudio para determinar la presencia de m?s l?quido alrededor de los ganglios linf?ticos e hinchaz?n en la mama y el brazo (linfedema). Siga las instrucciones del m?dico acerca de la frecuencia con la que se debe hacer los controles. ?Si le han extra?do alg?n ganglio linf?tico durante el procedimiento, aseg?rese de darles toda la informaci?n a sus m?dicos. Esta es una informaci?n importante que se debe compartir antes de ciertos procedimientos, como an?lisis de sangre o medici?n de presi?n arterial. ?Comun?quese con un m?dico si: ?Presenta una erupci?n cut?nea. ?Tiene fiebre. ?Los analg?sicos no surten efecto. ?Tiene hinchaz?n, debilidad o adormecimiento en el brazo que no mejora despu?s de unas semanas. ?Tiene una hinchaz?n nueva en la mama. ?Tiene cualquiera de estos signos de infecci?n: ?M?s enrojecimiento, hinchaz?n o dolor en la zona de la incisi?n. ?L?quido o sangre que salen de la incisi?n. ?Calor que proviene de la zona de la incisi?n. ?Pus o mal olor en TEFL teacher de la incisi?n. ?Solicite ayuda inmediatamente si tiene: ?Location manager intenso en la mama o el brazo. ?Hinchaz?n en las piernas o los brazos. ?Enrojecimiento, calor o dolor en las piernas o los brazos. ?Dolor de pecho. ?Dificultad para respirar. ?Resumen ?Despu?s del procedimiento, es com?n tener sensibilidad al tacto e hinchaz?n en la mama, y rigidez en el brazo y [place]. ?Siga las instrucciones del m?dico acerca del cuidado de la incisi?n. ?No levante ning?n objeto que pese m?s de 10 libras (4,5 kg) o el l?mite de [employer] hayan indicado, hasta que el m?dico le diga que puede [place]. Evite levantar objetos con el brazo que est? del lado de la cirug?a. ?Si le han extra?do alg?n ganglio linf?tico durante el  procedimiento, aseg?rese de darles toda la informaci?n a sus m?dicos. Esta es una informaci?n importante que se debe compartir antes de ciertos procedimientos, como an?lisis de sangre o medici?n de presi?n arterial. ?Esta informaci?n no tiene Marine scientist el consejo del m?dico. Aseg?rese de hacerle al m?dico cualquier pregunta que tenga. ?Document Revised: 11/10/2018 Document Reviewed: 11/10/2018 ?Elsevier Patient Education ? [place] ?      Instruccionnes de alta ? ? ? ? ?1.  Las drogas que se Statistician en su cuerpo [employer], asi       ?     que por las proximas 24 horas usted no debe: ?  Conducir Scientist, research (medical)) un automovil ?  Hacer ninguna decision legal ?  Tomar ninguna bebida alcoholica ? ?2.  A) [place] puede comenzar una dieta regular.  Es mejor que hoy empiece con  ?         liquidos y gradualmente anada comidas solidas. ? ?     B) Puede comer cualquier comida que desee pero es mejor empezar con liquidos,             ?         luego sopitas con galletas saladas y gradualmente llegar a las comidas solidas. ? ?  3.  Por favor avise a su medico inmediatamente si usted tiene algun sangrado anormal,  ?     tiene dificultad con la respiracion, enrojecimiento y Social research officer, government en el sitio de la cirugia, drenaje,  ?     fiebro o dolor que se alivia con [place]. ? ?4.  A) Su visita posoperatoria (despues de su operacion) es con el ? Dr. Dahlia Byes ? ?      B)  Por favor llame para hacer la cita posoperatoria. ? ?5.  Istrucciones especificas :  ?

## 2021-06-11 NOTE — Transfer of Care (Signed)
Immediate Anesthesia Transfer of Care Note ? ?Patient: Deanna Hall ? ?Procedure(s) Performed: BREAST LUMPECTOMY WITH RADIOFREQUENCY TAG IDENTIFICATION with RNFA or 2nd scrub (Right) ? ?Patient Location: PACU ? ?Anesthesia Type:General ? ?Level of Consciousness: drowsy ? ?Airway & Oxygen Therapy: Patient Spontanous Breathing and Patient connected to face mask oxygen ? ?Post-op Assessment: Report given to RN ? ?Post vital signs: stable ? ?Last Vitals:  ?Vitals Value Taken Time  ?BP 104/63 06/11/21 0921  ?Temp    ?Pulse 89 06/11/21 0922  ?Resp 13 06/11/21 0922  ?SpO2 98 % 06/11/21 0922  ?Vitals shown include unvalidated device data. ? ?Last Pain:  ?Vitals:  ? 06/11/21 0629  ?TempSrc: Oral  ?PainSc: 0-No pain  ?   ? ?  ? ?Complications: No notable events documented. ?

## 2021-06-11 NOTE — Progress Notes (Signed)
Pt states pain is 9/10 after 250mg Fentanyl. Dr. VBoston Servicenotified. Acknowledged. Orders received. See MAR.  ?

## 2021-06-11 NOTE — Interval H&P Note (Signed)
History and Physical Interval Note: ? ?06/11/2021 ?7:17 AM ? ?Deanna Hall  has presented today for surgery, with the diagnosis of right breat fibro adenoma.  The various methods of treatment have been discussed with the patient and family. After consideration of risks, benefits and other options for treatment, the patient has consented to  Procedure(s) with comments: ?BREAST LUMPECTOMY WITH RADIOFREQUENCY TAG IDENTIFICATION with RNFA or 2nd scrub (Right) - Provider requesting 1.5 hours / 90 minutes for procedure as a surgical intervention.  The patient's history has been reviewed, patient examined, no change in status, stable for surgery.  I have reviewed the patient's chart and labs.  Questions were answered to the patient's satisfaction.   ? ? ?[place] ? ? ?

## 2021-06-11 NOTE — Anesthesia Procedure Notes (Signed)
Procedure Name: LMA Insertion ?Date/Time: 06/11/2021 7:37 AM ?Performed by: Lerry Liner, CRNA ?Pre-anesthesia Checklist: Patient identified, Emergency Drugs available, Suction available and Patient being monitored ?Patient Re-evaluated:Patient Re-evaluated prior to induction ?Oxygen Delivery Method: Circle system utilized ?Preoxygenation: Pre-oxygenation with 100% oxygen ?Induction Type: IV induction ?Ventilation: Mask ventilation without difficulty ?LMA: LMA inserted ?LMA Size: 4.0 ?Tube type: Oral ?Number of attempts: 1 ?Airway Equipment and Method: Stylet and Oral airway ?Placement Confirmation: positive ETCO2 and breath sounds checked- equal and bilateral ?Tube secured with: Tape ?Dental Injury: Teeth and Oropharynx as per pre-operative assessment  ? ? ? ? ?

## 2021-06-11 NOTE — Progress Notes (Signed)
Pt states pain is 9/10. Dr. [facility] notified. Acknowledged. Orders received. See MAR ?

## 2021-06-11 NOTE — Progress Notes (Signed)
Pt CBG: 220: Dr. [facility] Service notified. Acknowledged. No new orders at this time.  ?

## 2021-06-11 NOTE — Anesthesia Preprocedure Evaluation (Addendum)
Anesthesia Evaluation  ?Patient identified by MRN, date of birth, ID band ?Patient awake ? ? ? ?Reviewed: ?Allergy & Precautions, NPO status , Patient's Chart, lab work & pertinent test results ? ?Airway ?Mallampati: II ? ?TM Distance: >3 FB ?Neck ROM: Full ? ? ? Dental ? ?(+) Teeth Intact ?  ?Pulmonary ?neg pulmonary ROS, asthma ,  ?  ?Pulmonary exam normal ?breath sounds clear to auscultation ? ? ? ? ? ? Cardiovascular ?Exercise Tolerance: Good ?hypertension, Pt. on medications ?negative cardio ROS ?Normal cardiovascular exam ?Rhythm:Regular Rate:Normal ? ? ?  ?Neuro/Psych ? Headaches, Depression negative neurological ROS ? negative psych ROS  ? GI/Hepatic ?negative GI ROS, Neg liver ROS, GERD  ,  ?Endo/Other  ?negative endocrine ROSdiabetes, Well Controlled, Type 2, Oral Hypoglycemic Agents ? Renal/GU ?negative Renal ROS  ?negative genitourinary ?  ?Musculoskeletal ?negative musculoskeletal ROS ?(+)  ? Abdominal ?Normal abdominal exam  (+)   ?Peds ?negative pediatric ROS ?(+)  Hematology ?negative hematology ROS ?(+)   ?Anesthesia Other Findings ?Past Medical History: ?2010: Asthma ?No date: Complication of anesthesia ?    Comment:  woke up during one of her breast surgery ?No date: Depression ?No date: Diabetes mellitus without complication ([place]) ?No date: GERD (gastroesophageal reflux disease) ?    Comment:  occ ?No date: Headache ?    Comment:  migraines ?No date: Hypertension ?2010: Lump or mass in breast ? ?Past Surgical History: ?05/02/2021: BIOPSY BREAST; Right ?    Comment:  u/s bx x 3 heart 1:30 4cmfn, venus 10:00 3cmfn, ribbon  ?             10:00 2cmfn ?2017: BREAST BIOPSY; Right ?    Comment:  benign, s shape marker ?07/07/2017: BREAST BIOPSY; Right ?    Comment:  path pending, coil shape ?1999: BREAST CYST EXCISION; Bilateral ?    Comment:  neg ?2000: BREAST SURGERY; Bilateral ?    Comment:  bilateral-cyst removed  ? ?BMI   ? Body Mass Index: 29.52 kg/m?  ?  ? ?  Reproductive/Obstetrics ?negative OB ROS ? ?  ? ? ? ? ? ? ? ? ? ? ? ? ? ?  ?  ? ? ? ? ? ? ? ?Anesthesia Physical ?Anesthesia Plan ? ?ASA: 2 ? ?Anesthesia Plan: General  ? ?Post-op Pain Management:   ? ?Induction: Intravenous ? ?PONV Risk Score and Plan: 1 and Ondansetron and Dexamethasone ? ?Airway Management Planned: LMA ? ?Additional Equipment:  ? ?Intra-op Plan:  ? ?Post-operative Plan:  ? ?Informed Consent: I have reviewed the patients History and Physical, chart, labs and discussed the procedure including the risks, benefits and alternatives for the proposed anesthesia with the patient or authorized representative who has indicated his/her understanding and acceptance.  ? ? ? ?Dental Advisory Given ? ?Plan Discussed with: CRNA and Surgeon ? ?Anesthesia Plan Comments:   ? ? ? ? ? ? ?Anesthesia Quick Evaluation ? ?

## 2021-06-11 NOTE — Op Note (Signed)
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 The benefits, complications, treatment options, and expected outcomes were discussed with the patient. The risks of bleeding, infection, recurrence of symptoms, failure to resolve symptoms, hematoma, seroma, open wound, cosmetic deformity, and the need for further surgery were discussed.  The patient was taken to Operating Room, identified  and the procedure verified.  A Time Out was held and the above information confirmed.  Prior to the induction of general anesthesia, antibiotic prophylaxis was administered. VTE prophylaxis was in place. Appropriate anesthesia was then administered and tolerated well. The chest was prepped with Chloraprep and draped in the sterile fashion. The patient was positioned in the supine position.  Using the hand-held probe LOCALIZER the area of the clip was identified, an incision was made . Random circumferential sub q flap were created with Electrocautery. I placed a 2-0 vicryl within the targeted breast tissue. Lumpectomy was performed by elevating the vicryl stitch and dividing breast tissue circumferentially with good wide margins.  The specimen was removed and placed on the Faxitron machine. Good gross margins visualized and clip and RF tag were well centered.  Tissue advancement flaps were performed to decrease the volume deficit in the area of the resection.  Please note that the total void area was 8 x 8 cm equals 64  cm.  The chest wall flaps were created by incising the breast parenchyma from the pectoralis fascia in a circumferential method.  The breast parenchyma was then reapproximated in a deep to superficial fashion using interrupted 2-0 Vicryl sutures.  Please note that I placed 2 deep layers of 2-0 Vicryl's.  Once assuring that hemostasis was adequate and checked multiple times the wound was closed with 4-0 subcuticular Monocryl sutures. Marcaine 1/4 % w epi injected around incision site. Dermabond was placed  Patient was taken to the recovery room in stable condition.   Caroleen Hamman , MD, FACS

## 2021-06-11 NOTE — Anesthesia Postprocedure Evaluation (Signed)
Anesthesia Post Note ? ?Patient: Deanna Hall ? ?Procedure(s) Performed: BREAST LUMPECTOMY WITH RADIOFREQUENCY TAG IDENTIFICATION with RNFA or 2nd scrub (Right) ? ?Patient location during evaluation: PACU ?Anesthesia Type: General ?Level of consciousness: awake and awake and alert ?Pain management: satisfactory to patient ?Vital Signs Assessment: post-procedure vital signs reviewed and stable ?Respiratory status: spontaneous breathing and respiratory function stable ?Cardiovascular status: stable ?Anesthetic complications: no ? ? ?No notable events documented. ? ? ?Last Vitals:  ?Vitals:  ? 06/11/21 0930 06/11/21 0945  ?BP: 109/68 125/85  ?Pulse: 90 95  ?Resp: 12 15  ?Temp:    ?SpO2: 98% 99%  ?  ?Last Pain:  ?Vitals:  ? 06/11/21 0945  ?TempSrc:   ?PainSc: 10-Worst pain ever  ? ? ?  ?  ?  ?  ?  ?  ? ?VAN STAVEREN,Krystyl Cannell ? ? ? ? ?

## 2021-06-13 LAB — SURGICAL PATHOLOGY

## 2021-06-28 ENCOUNTER — Other Ambulatory Visit: Payer: PRIVATE HEALTH INSURANCE

## 2021-07-03 ENCOUNTER — Other Ambulatory Visit: Payer: Self-pay

## 2021-07-03 ENCOUNTER — Ambulatory Visit (INDEPENDENT_AMBULATORY_CARE_PROVIDER_SITE_OTHER): Payer: Self-pay | Admitting: Surgery

## 2021-07-03 ENCOUNTER — Encounter: Payer: Self-pay | Admitting: Surgery

## 2021-07-03 VITALS — BP 133/86 | HR 101 | Temp 98.8°F | Wt 171.8 lb

## 2021-07-03 DIAGNOSIS — D241 Benign neoplasm of right breast: Secondary | ICD-10-CM

## 2021-07-03 DIAGNOSIS — Z09 Encounter for follow-up examination after completed treatment for conditions other than malignant neoplasm: Secondary | ICD-10-CM

## 2021-07-05 ENCOUNTER — Encounter: Payer: Self-pay | Admitting: Surgery

## 2021-07-05 NOTE — Progress Notes (Signed)
S/p lumpectomy for benign disease ( fibroadenoma) , very pleased w cosmetic result. Path d/w pt in detail. No complaints. ? ?PE NAD ?Breast: incisions healed, improved contour and lift of right breast from tissue rearrangement. Areola and nipple nml. ? ?A/P Doing very well ?RTC 1 year or so w mammo and PE ?

## 2021-12-17 ENCOUNTER — Encounter: Payer: Self-pay | Admitting: Physician Assistant

## 2021-12-18 ENCOUNTER — Other Ambulatory Visit: Payer: Self-pay

## 2021-12-18 DIAGNOSIS — N6320 Unspecified lump in the left breast, unspecified quadrant: Secondary | ICD-10-CM

## 2022-01-01 ENCOUNTER — Other Ambulatory Visit: Payer: Self-pay

## 2022-01-01 DIAGNOSIS — N6320 Unspecified lump in the left breast, unspecified quadrant: Secondary | ICD-10-CM

## 2022-01-07 ENCOUNTER — Ambulatory Visit: Payer: PRIVATE HEALTH INSURANCE | Attending: Hematology and Oncology | Admitting: Hematology and Oncology

## 2022-01-07 ENCOUNTER — Ambulatory Visit
Admission: RE | Admit: 2022-01-07 | Discharge: 2022-01-07 | Disposition: A | Discharge status: Home / Self Care | Payer: Self-pay | Source: Ambulatory Visit | Attending: Obstetrics and Gynecology | Admitting: Obstetrics and Gynecology

## 2022-01-07 VITALS — BP 131/93 | Wt 174.9 lb

## 2022-01-07 DIAGNOSIS — N6310 Unspecified lump in the right breast, unspecified quadrant: Secondary | ICD-10-CM

## 2022-01-07 DIAGNOSIS — N6320 Unspecified lump in the left breast, unspecified quadrant: Secondary | ICD-10-CM

## 2022-01-07 NOTE — Progress Notes (Signed)
[name] is a 46 y.o. female who presents to [facility] clinic today with complaint of right and left possible masses seen on recent imaging.    Pap Smear: Pap not smear completed today. Last Pap smear was 08/04/19 at [facility] clinic and was normal. Per patient has no history of an abnormal Pap smear. Last Pap smear result is not available in Epic.   Physical exam: Breasts Breasts symmetrical. No skin abnormalities bilateral breasts. No nipple retraction bilateral breasts. No nipple discharge bilateral breasts. No lymphadenopathy. No lumps palpated bilateral breasts.   MM Breast Surgical Specimen  Result Date: 06/11/2021 CLINICAL DATA:  Status post RF device localized RIGHT breast lumpectomy. EXAM: SPECIMEN RADIOGRAPH OF THE RIGHT BREAST COMPARISON:  Previous exam(s). FINDINGS: Status post excision of the RIGHT breast. The RF device and Heart shaped clip are present within the specimen. I discussed the findings with Dr. Dahlia Byes at the time of interpretation. IMPRESSION: Specimen radiograph of the RIGHT breast. Electronically Signed   By: Nolon Nations M.D.   On: 06/11/2021 08:54  MM DIAG BREAST TOMO UNI RIGHT  Result Date: 06/05/2021 CLINICAL DATA:  Status post ultrasound-guided RF device placement in right breast 1:30 o'clock mass. EXAM: DIAGNOSTIC RIGHT MAMMOGRAM POST ULTRASOUND-GUIDED RF device PLACEMENT COMPARISON:  Prior films FINDINGS: Mammographic images were obtained following ultrasound-guided RF device placement. These demonstrate RF device in mass of concern right breast 1:30 o'clock. IMPRESSION: Appropriate location of the RF device. Final Assessment: Post Procedure Mammograms for RF device placement Electronically Signed   By: Abelardo Diesel M.D.   On: 06/05/2021 16:27  MM CLIP PLACEMENT LEFT  Result Date: 05/27/2021 CLINICAL DATA:  Status post MRI guided core needle biopsy retroareolar left breast mass. EXAM: 3D DIAGNOSTIC LEFT MAMMOGRAM POST MRI BIOPSY COMPARISON:   Previous exam(s). FINDINGS: 3D Mammographic images were obtained following MRI guided biopsy of retroareolar left breast mass. The biopsy marking clip is in expected position at the site of biopsy. IMPRESSION: Appropriate positioning of the dumbbell shaped biopsy marking clip at the site of biopsy in the retroareolar left breast. Final Assessment: Post Procedure Mammograms for Marker Placement Electronically Signed   By: Lovey Newcomer M.D.   On: 05/27/2021 09:12  MS CLIP PLACEMENT RIGHT  Result Date: 05/02/2021 CLINICAL DATA:  Status post 3 site ultrasound-guided biopsy EXAM: DIAGNOSTIC RIGHT MAMMOGRAM POST ULTRASOUND BIOPSY COMPARISON:  Previous exam(s). FINDINGS: Site 1: 1:30 4 cm from the nipple Mammographic images were obtained following ultrasound guided biopsy of a mass at 1:30. The heart biopsy marking clip is in expected position at the site of biopsy. Site 2:10 o'clock 3 cm from the nipple Mammographic images were obtained following ultrasound guided biopsy of a mass at 10 o'clock. The VENUS biopsy marking clip is in expected position at the site of biopsy. Site 3:10 o'clock 2 cm from the nipple Mammographic images were obtained following ultrasound guided biopsy of a mass at 10 o'clock. The RIBBON biopsy marking clip is in expected position at the site of biopsy. IMPRESSION: 1. Appropriate positioning of the HEART shaped biopsy marking clip at the site of biopsy in the RIGHT upper inner breast. 2. Appropriate positioning of the VENUS shaped biopsy marking clip at the site of biopsy in the RIGHT upper outer breast. 3. Appropriate positioning of the RIBBON shaped biopsy marking clip at the site of biopsy in the RIGHT upper outer breast. Final Assessment: Post Procedure Mammograms for Marker Placement Electronically Signed   By: Valentino Saxon M.D.   On:  05/02/2021 09:29  MS DIGITAL SCREENING TOMO BILATERAL  Result Date: 03/26/2021 CLINICAL DATA:  Screening. EXAM: DIGITAL SCREENING BILATERAL  MAMMOGRAM WITH TOMOSYNTHESIS AND CAD TECHNIQUE: Bilateral screening digital craniocaudal and mediolateral oblique mammograms were obtained. Bilateral screening digital breast tomosynthesis was performed. The images were evaluated with computer-aided detection. COMPARISON:  Previous exam(s). ACR Breast Density Category c: The breast tissue is heterogeneously dense, which may obscure small masses. FINDINGS: In the right breast, 2 possible masses warrant further evaluation. In the left breast, no findings suspicious for malignancy. IMPRESSION: Further evaluation is suggested for 2 possible masses in the right breast. RECOMMENDATION: Ultrasound of the right breast. (Code:[id number]-R-56M) The patient will be contacted regarding the findings, and additional imaging will be scheduled. BI-RADS CATEGORY  0: Incomplete. Need additional imaging evaluation and/or prior mammograms for comparison. Electronically Signed   By: Marin Olp M.D.   On: 03/26/2021 16:33   MS DIGITAL DIAG UNI RIGHT  Result Date: 07/07/2017 CLINICAL DATA:  Post ultrasound-guided core needle biopsy of right breast mass. EXAM: DIAGNOSTIC RIGHT MAMMOGRAM POST ULTRASOUND BIOPSY COMPARISON:  Previous exam(s). FINDINGS: Mammographic images were obtained following ultrasound guided biopsy of right breast 9 o'clock mass. Two-view mammography demonstrates presence of coil shaped marker within the biopsied mass in the right 9 o'clock breast. Expected post biopsy changes are seen. IMPRESSION: Successful placement of coil shaped marker, post ultrasound-guided core needle biopsy of right breast 9 o'clock mass. Final Assessment: Post Procedure Mammograms for Marker Placement Electronically Signed   By: Fidela Salisbury M.D.   On: 07/07/2017 08:48   MS DIGITAL DIAG TOMO BILAT  Result Date: 06/19/2017 CLINICAL DATA:  Bilateral upper outer quadrants areas of palpable concern felt by the patient. History of benign excisional biopsies bilaterally. History of core  needle biopsy of right breast mass. EXAM: DIGITAL DIAGNOSTIC BILATERAL MAMMOGRAM WITH CAD AND TOMO ULTRASOUND BILATERAL BREAST COMPARISON:  Previous exam(s). ACR Breast Density Category c: The breast tissue is heterogeneously dense, which may obscure small masses. FINDINGS: Mammographically, there is a lobulated isodense to breast parenchyma mass in the right breast upper outer quadrant, posterior depth, which corresponds to 1 of the palpable areas described by the patient. Mammographically, there are no suspicious masses, areas of nonsurgical architectural distortion or microcalcifications in the left breast. There are bilateral postsurgical changes. Mammographic images were processed with CAD. On physical exam, there is a moderately firm palpable rounded mass in the right breast upper outer quadrant. No other focal abnormalities are palpated. Targeted left breast ultrasound is performed, showing no suspicious masses or shadowing lesions in the left breast. Targeted right breast ultrasound is performed, showing 9 o'clock 6 cm from the nipple hypoechoic circumscribed lobulated mass measuring 1.6 x 0.8 x 1.3 cm. This mass corresponds to the mammographically seen previously biopsied mass, however it demonstrates slight increase in size from 2012 when it measured 1.2 x 1.3 x 0.7 cm. No evidence of right axillary lymphadenopathy. IMPRESSION: Right breast probably benign mass which however demonstrates mild increase in size, because of which ultrasound-guided core needle biopsy is recommended. No mammographic or sonographic evidence of malignancy in the left breast. RECOMMENDATION: Ultrasound-guided core needle biopsy of the right breast. Otherwise, the further management of the bilateral breasts areas of palpable concern should be based on clinical grounds. I have discussed the findings and recommendations with the patient. Results were also provided in writing at the conclusion of the visit. If applicable, a reminder  letter will be sent to the patient regarding the next appointment. BI-RADS CATEGORY  3: Probably benign. Electronically Signed   By: Fidela Salisbury M.D.   On: 06/19/2017 11:55        Pelvic/Bimanual Pap is not indicated today    Smoking History: Patient has never smoked and was not referred to quit line.    Patient Navigation: Patient education provided. Access to services provided for patient through [place] interpreter provided. No transportation provided   Colorectal Cancer Screening: Per patient has never had colonoscopy completed No complaints today.    Breast and Cervical Cancer Risk Assessment: Patient does not have family history of breast cancer, known genetic mutations, or radiation treatment to the chest before age 73. Patient does not have history of cervical dysplasia, immunocompromised, or DES exposure in-utero.  Risk Assessment   No risk assessment data for the current encounter  Risk Scores       03/26/2021   Last edited by: Rico Junker, RN   5-year risk: 1 %   Lifetime risk: 8.6 %            A: B[facility]CP exam without pap smear Complaint of possible left and right breast masses noted on previous imaging.  P: [facility]. Appointment scheduled 01/07/22 @ 10:20 am.  Melodye Ped, NP 01/07/2022 8:32 AM

## 2022-01-16 ENCOUNTER — Other Ambulatory Visit: Payer: Self-pay | Admitting: Obstetrics and Gynecology

## 2022-01-16 DIAGNOSIS — R928 Other abnormal and inconclusive findings on diagnostic imaging of breast: Secondary | ICD-10-CM

## 2022-03-17 ENCOUNTER — Other Ambulatory Visit: Payer: Self-pay

## 2022-03-17 DIAGNOSIS — N6310 Unspecified lump in the right breast, unspecified quadrant: Secondary | ICD-10-CM

## 2022-05-02 ENCOUNTER — Ambulatory Visit
Admission: RE | Admit: 2022-05-02 | Discharge: 2022-05-02 | Disposition: A | Discharge status: Home / Self Care | Payer: PRIVATE HEALTH INSURANCE | Source: Ambulatory Visit | Attending: Obstetrics and Gynecology | Admitting: Obstetrics and Gynecology

## 2022-05-02 DIAGNOSIS — N6320 Unspecified lump in the left breast, unspecified quadrant: Secondary | ICD-10-CM | POA: Insufficient documentation

## 2022-05-06 ENCOUNTER — Ambulatory Visit
Admission: RE | Admit: 2022-05-06 | Discharge: 2022-05-06 | Disposition: A | Discharge status: Home / Self Care | Payer: PRIVATE HEALTH INSURANCE | Source: Ambulatory Visit | Attending: Obstetrics and Gynecology | Admitting: Obstetrics and Gynecology

## 2022-05-06 DIAGNOSIS — R928 Other abnormal and inconclusive findings on diagnostic imaging of breast: Secondary | ICD-10-CM

## 2022-05-06 MED ORDER — GADOBUTROL 1 MMOL/ML IV SOLN
7.5000 mL | Freq: Once | INTRAVENOUS | Status: AC | PRN
  Administered 2022-05-06: 7.5 mL via INTRAVENOUS

## 2022-05-14 ENCOUNTER — Telehealth: Payer: Self-pay | Admitting: Hematology and Oncology

## 2022-05-19 ENCOUNTER — Other Ambulatory Visit: Payer: Self-pay

## 2022-05-19 ENCOUNTER — Encounter: Payer: Self-pay | Admitting: Surgery

## 2022-05-19 ENCOUNTER — Ambulatory Visit (INDEPENDENT_AMBULATORY_CARE_PROVIDER_SITE_OTHER): Payer: Self-pay | Admitting: Surgery

## 2022-05-19 VITALS — BP 133/86 | HR 81 | Temp 98.1°F | Ht 64.0 in | Wt 167.0 lb

## 2022-05-19 DIAGNOSIS — D241 Benign neoplasm of right breast: Secondary | ICD-10-CM

## 2022-05-19 NOTE — Progress Notes (Signed)
Outpatient Surgical Follow Up  05/19/2022  Deanna Hall is an 47 y.o. female.   Chief Complaint  Patient presents with   Follow-up    Right breast discuss MRI    HPI: Deanna Hall is a 47 year old female well-known to me with prior history of fibroadenoma on the right breast status postlumpectomy and tissue rearrangement.  Did have recent MRI that have personally reviewed showing evidence to breast cystic lesions on the left side without any suspicious findings. She had prior breast cyst excised on both breasts SHe is otherwise doing well  Past Medical History:  Diagnosis Date   Asthma [id number]   Complication of anesthesia    woke up during one of her breast surgery   Depression    Diabetes mellitus without complication ([place])    GERD (gastroesophageal reflux disease)    occ   Headache    migraines   Hypertension    Lump or mass in breast 2010    Past Surgical History:  Procedure Laterality Date   BIOPSY BREAST Right 05/02/2021   u/s bx x 3 heart 1:30 4cmfn, venus 10:00 3cmfn, ribbon 10:00 2cmfn   BREAST BIOPSY Right 2017   benign, s shape marker   BREAST BIOPSY Right 07/07/2017   path pending, coil shape   BREAST CYST EXCISION Bilateral 1999   neg   BREAST EXCISIONAL BIOPSY Right    1:30 Excision   BREAST LUMPECTOMY WITH RADIOFREQUENCY TAG IDENTIFICATION Right 06/11/2021   Procedure: BREAST LUMPECTOMY WITH RADIOFREQUENCY TAG IDENTIFICATION with RNFA or 2nd scrub;  Surgeon: Jules Husbands, MD;  Location: [facility] ORS;  Service: General;  Laterality: Right;  Provider requesting 1.5 hours / 90 minutes for procedure   BREAST SURGERY Bilateral 2000   bilateral-cyst removed     Family History  Problem Relation Age of Onset   Hyperlipidemia Mother    Hypertension Mother    Diabetes Mother    Hyperlipidemia Father    Hypertension Father    Diabetes Father    Brain cancer Father     Social History:  reports that she has never smoked. She has never used smokeless tobacco.  She reports that she does not drink alcohol and does not use drugs.  Allergies:  Allergies  Allergen Reactions   Naproxen Shortness Of Breath, Swelling, Rash and Anaphylaxis   Penicillins Hives    BREATHING PROBLEMS    Amoxicillin     BREATHING PROBLEMS    Augmentin [Amoxicillin-Pot Clavulanate]     BREATHING PROBLEMS    Nsaids Swelling   Aspirin Rash   Ibuprofen Rash    BREASTING PROBLEMS     Medications reviewed.    ROS Full ROS performed and is otherwise negative other than what is stated in HPI   BP 133/86   Pulse 81   Temp 98.1 F (36.7 C) (Oral)   Ht 5' 4"$  (1.626 m)   Wt 167 lb (75.8 kg)   SpO2 98%   BMI 28.67 kg/m   Physical Exam EYES: Pupils are equal, round,  Sclera are non-icteric. EARS, NOSE, MOUTH AND THROAT: She is wearing a mask, Hearing is intact to voice. LYMPH NODES:  Lymph nodes in the neck are normal. RESPIRATORY:  Lungs are clear. There is normal respiratory effort, with equal breath sounds bilaterally, and without pathologic use of accessory muscles. CARDIOVASCULAR: Heart is regular without murmurs, gallops, or rubs. BREAST: She has bilateral lateral fibrocystic breast fulness. No discrete mass. Prior scars on both breast. Scar on the left breast w/o  significant deformity GI: The abdomen is NAD soft, nontender, and nondistended. There are no palpable masses. There is no hepatosplenomegaly. There are normal bowel sounds in all quadrants. GU: Rectal deferred.   MUSCULOSKELETAL: Normal muscle strength and tone. No cyanosis or edema.   SKIN: Turgor is good and there are no pathologic skin lesions or ulcers. NEUROLOGIC: Motor and sensation is grossly normal. Cranial nerves are grossly intact. PSYCH:  Oriented to person, place and time. Affect is normal.   Assessment/Plan: 47 year old female with history of fibroadenoma of the right breast now with multiple fibrocystic changes on bilateral breast without evidence of suspicious lesions.  We will  get another MRI in 1 year and continue to follow-up clinically.  No need for further surgical intervention at this time  Please note that I spent greater than 30 minutes in this encounter including personally reviewing imaging studies, coordinating her care, placing orders and performing appropriate documentation  Caroleen Hamman, MD [place] Surgeon

## 2022-05-19 NOTE — Patient Instructions (Addendum)
  We will contact you December 2024 to schedule Breast MRI for January 2025. Please call the office with questions or concerns.     Fibroadenoma Fibroadenoma  Un fibroadenoma es un bulto (tumor) en la mama. El bulto es benigno. Esto significa que no es cncer. Puede moverse debajo de la piel al tocarlo. Este tipo de tumor puede desarrollarse en una sola mama o en ambas. Cules son las causas? Se desconoce la causa de esta afeccin. Qu incrementa el riesgo? Ser mujer y [place] 44 y 80 aos. Ser una mujer de origen afroamericano. Cules son los signos o sntomas? Algunos bultos son demasiado pequeos para poder sentirlos. Si puede sentirlo, es posible que lo sienta como un bulto que es: [place]. [place]. Liso. Y que puede moverse un poco. Cmo se trata? Se realizan exmenes de mama regulares para detectar cambios en el bulto. En algunos casos, el bulto puede extraerse si: Es grande. Sigue creciendo. Causa dolor o cambios en la piel de la mama. Una nia tiene un bulto. Los bultos en las nias pequeas tienden a crecer con Physiological scientist. Siga estas instrucciones en su casa: Exmenes de mamas Examnese las [employer] en su casa como se lo haya indicado el mdico. Informe cualquier cambio o inquietud que tenga. Preste atencin a lo siguiente: El tamao del bulto. La apariencia y la sensibilidad de la piel en las [place]. La apariencia y la sensibilidad de los pezones.  Instrucciones generales No fume ni consuma ningn producto que contenga nicotina o tabaco. Estos pueden aumentar su riesgo de Optician, dispensing. Si necesita ayuda para dejar de fumar, consulte al mdico. Concurra a todas las visitas de seguimiento. Necesitar realizarse exmenes mamarios regularmente. Comunquese con un mdico si: El bulto cambia de tamao o se siente diferente. El bulto comienza a Engineer, drilling. Encuentra un bulto nuevo. Tiene cambios en cmo se ve la piel de la mama. Nota cualquier cambio en el pezn,  como: [place] que sale del pezn. Enrojecimiento alrededor del pezn. Resumen Un fibroadenoma es un bulto (tumor) en la mama. El bulto es benigno. Esto significa que no es cncer. Puede sentirse firme, redondeado o liso, y puede moverse un poco al tocarlo. Algunos bultos son demasiado pequeos para poder sentirlos. Examnese las [employer] en su casa. Est atenta a los [employer] del bulto. Comunquese con su mdico si el bulto se agranda o comienza a Education administrator. Adems, informe a su mdico si tiene algn cambio en el pezn o en cmo se ve la piel de la mama. Esta informacin no tiene Marine scientist el consejo del mdico. Asegrese de hacerle al mdico cualquier pregunta que tenga. Document Revised: 02/07/2020 Document Reviewed: 02/07/2020 Elsevier Patient Education  [place].

## 2022-10-27 ENCOUNTER — Other Ambulatory Visit: Payer: Self-pay | Admitting: Family Medicine

## 2022-10-27 DIAGNOSIS — G44209 Tension-type headache, unspecified, not intractable: Secondary | ICD-10-CM

## 2022-10-29 ENCOUNTER — Ambulatory Visit
Admission: RE | Admit: 2022-10-29 | Discharge: 2022-10-29 | Disposition: A | Discharge status: Home / Self Care | Payer: Self-pay | Source: Ambulatory Visit | Attending: Family Medicine | Admitting: Family Medicine

## 2022-10-29 DIAGNOSIS — G44209 Tension-type headache, unspecified, not intractable: Secondary | ICD-10-CM | POA: Insufficient documentation

## 2022-11-05 ENCOUNTER — Encounter: Payer: Self-pay | Admitting: Neurology

## 2023-01-06 ENCOUNTER — Other Ambulatory Visit: Payer: Self-pay

## 2023-01-06 DIAGNOSIS — N6310 Unspecified lump in the right breast, unspecified quadrant: Secondary | ICD-10-CM

## 2023-01-06 DIAGNOSIS — N6320 Unspecified lump in the left breast, unspecified quadrant: Secondary | ICD-10-CM

## 2023-01-28 ENCOUNTER — Encounter: Payer: Self-pay | Admitting: Physician Assistant

## 2023-02-09 ENCOUNTER — Other Ambulatory Visit: Payer: Self-pay

## 2023-02-09 ENCOUNTER — Ambulatory Visit
Admission: RE | Admit: 2023-02-09 | Discharge: 2023-02-09 | Disposition: A | Discharge status: Home / Self Care | Payer: Self-pay | Source: Ambulatory Visit | Attending: Obstetrics and Gynecology | Admitting: Obstetrics and Gynecology

## 2023-02-09 ENCOUNTER — Ambulatory Visit: Payer: Self-pay | Attending: Hematology and Oncology | Admitting: Hematology and Oncology

## 2023-02-09 VITALS — BP 121/81 | Wt 172.4 lb

## 2023-02-09 DIAGNOSIS — N6310 Unspecified lump in the right breast, unspecified quadrant: Secondary | ICD-10-CM

## 2023-02-09 DIAGNOSIS — Z124 Encounter for screening for malignant neoplasm of cervix: Secondary | ICD-10-CM

## 2023-02-09 DIAGNOSIS — N6320 Unspecified lump in the left breast, unspecified quadrant: Secondary | ICD-10-CM | POA: Insufficient documentation

## 2023-02-09 DIAGNOSIS — Z01419 Encounter for gynecological examination (general) (routine) without abnormal findings: Secondary | ICD-10-CM

## 2023-02-09 NOTE — Patient Instructions (Signed)
Taught Deanna Hall about self breast awareness and gave educational materials to take home. Patient did need a Pap smear today due to last Pap smear was in 07/25/2019 per patient. Let her know BCCCP will cover Pap smears every 5 years unless has a history of abnormal Pap smears. Referred patient to the [facility] for diagnostic mammogram. Appointment scheduled for 02/09/2023. Patient aware of appointment and will be there. Let patient know will follow up with her within the next couple weeks with results. [name] verbalized understanding.  Pascal Lux, NP 1:14 PM

## 2023-02-09 NOTE — Progress Notes (Signed)
Deanna Hall is a 47 y.o. [phone] female who presents to [facility] clinic today with no complaints. Follow up right breast mass.   Pap Smear: Pap smear completed today. Last Pap smear was 08/04/2019 at [facility] clinic and was normal. Per patient has no history of an abnormal Pap smear. Last Pap smear result is available in Epic.   Physical exam: Breasts Breasts symmetrical. No skin abnormalities bilateral breasts. No nipple retraction bilateral breasts. No nipple discharge bilateral breasts. No lymphadenopathy. No lumps palpated bilateral breasts.     MS DIGITAL DIAG TOMO BILAT  Result Date: 01/07/2022 CLINICAL DATA:  47 year old female presenting for delayed six-month follow-up of probably benign masses in the right breast that were biopsied demonstrating fibroepithelial lesion. Patient had a third mass biopsied in the upper inner right breast that was surgically excised. 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 EXAM: DIGITAL DIAGNOSTIC BILATERAL MAMMOGRAM WITH TOMOSYNTHESIS; ULTRASOUND RIGHT BREAST LIMITED TECHNIQUE: Bilateral digital diagnostic mammography and breast tomosynthesis was performed.; Targeted ultrasound examination of the right breast was performed COMPARISON:  Previous exam(s). ACR Breast Density Category c: The breast tissue is heterogeneously dense, which may obscure small masses. FINDINGS: Mammogram: Right breast: There are ribbon and Venus shaped biopsy marking clips in the upper outer right breast at the sites of recent benign biopsy demonstrating fibroepithelial lesion. The biopsied masses appear stable. There are no new suspicious findings elsewhere in the right breast. Left breast: No new suspicious mass, distortion, or microcalcifications are identified to suggest presence of malignancy. Ultrasound: Targeted ultrasound is performed in the right breast at 10 o'clock 3 cm from the nipple demonstrating an oval  circumscribed hypoechoic mass measuring 1.0 x 0.6 x 1.1 cm, previously measuring 1.0 x 0.6 x 1.0 cm. Targeted ultrasound performed in the right breast at 10 o'clock 2 cm from the nipple demonstrating an oval circumscribed hypoechoic mass measuring 0.9 x 0.4 x 0.7 cm, previously measuring 1.0 x 0.4 x 0.8 cm. IMPRESSION: 1. Stable probably benign masses in the right breast at 10 o'clock, previously biopsied demonstrating fibroepithelial lesion. 2.  No new suspicious findings in the bilateral breasts. RECOMMENDATION: 1.  Right breast ultrasound in January 2023. 2. Patient is due for bilateral breast MRI as a follow-up from recent biopsy. I have discussed the findings and recommendations with the patient. If applicable, a reminder letter will be sent to the patient regarding the next appointment. BI-RADS CATEGORY  3: Probably benign. Electronically Signed   By: Emmaline Kluver M.D.   On: 01/07/2022 10:42  MM Breast Surgical Specimen  Result Date: 06/11/2021 CLINICAL DATA:  Status post RF device localized RIGHT breast lumpectomy. EXAM: SPECIMEN RADIOGRAPH OF THE RIGHT BREAST COMPARISON:  Previous exam(s). FINDINGS: Status post excision of the RIGHT breast. The RF device and Heart shaped clip are present within the specimen. I discussed the findings with Dr. Everlene Farrier at the time of interpretation. IMPRESSION: Specimen radiograph of the RIGHT breast. Electronically Signed   By: Norva Pavlov M.D.   On: 06/11/2021 08:54  MM DIAG BREAST TOMO UNI RIGHT  Result Date: 06/05/2021 CLINICAL DATA:  Status post ultrasound-guided RF device placement in right breast 1:30 o'clock mass. EXAM: DIAGNOSTIC RIGHT MAMMOGRAM POST ULTRASOUND-GUIDED RF device PLACEMENT COMPARISON:  Prior films FINDINGS: Mammographic images were obtained following ultrasound-guided RF device placement. These demonstrate RF device in mass of concern right breast 1:30 o'clock. IMPRESSION: Appropriate location of the RF device. Final Assessment: Post  Procedure Mammograms for RF  device placement Electronically Signed   By: Sherian Rein M.D.   On: 06/05/2021 16:27  MM CLIP PLACEMENT LEFT  Result Date: 05/27/2021 CLINICAL DATA:  Status post MRI guided core needle biopsy retroareolar left breast mass. EXAM: 3D DIAGNOSTIC LEFT MAMMOGRAM POST MRI BIOPSY COMPARISON:  Previous exam(s). FINDINGS: 3D Mammographic images were obtained following MRI guided biopsy of retroareolar left breast mass. The biopsy marking clip is in expected position at the site of biopsy. IMPRESSION: Appropriate positioning of the dumbbell shaped biopsy marking clip at the site of biopsy in the retroareolar left breast. Final Assessment: Post Procedure Mammograms for Marker Placement Electronically Signed   By: Annia Belt M.D.   On: 05/27/2021 09:12  MS CLIP PLACEMENT RIGHT  Result Date: 05/02/2021 CLINICAL DATA:  Status post 3 site ultrasound-guided biopsy EXAM: DIAGNOSTIC RIGHT MAMMOGRAM POST ULTRASOUND BIOPSY COMPARISON:  Previous exam(s). FINDINGS: Site 1: 1:30 4 cm from the nipple Mammographic images were obtained following ultrasound guided biopsy of a mass at 1:30. The heart biopsy marking clip is in expected position at the site of biopsy. Site 2:10 o'clock 3 cm from the nipple Mammographic images were obtained following ultrasound guided biopsy of a mass at 10 o'clock. The VENUS biopsy marking clip is in expected position at the site of biopsy. Site 3:10 o'clock 2 cm from the nipple Mammographic images were obtained following ultrasound guided biopsy of a mass at 10 o'clock. The RIBBON biopsy marking clip is in expected position at the site of biopsy. IMPRESSION: 1. Appropriate positioning of the HEART shaped biopsy marking clip at the site of biopsy in the RIGHT upper inner breast. 2. Appropriate positioning of the VENUS shaped biopsy marking clip at the site of biopsy in the RIGHT upper outer breast. 3. Appropriate positioning of the RIBBON shaped biopsy marking clip at the  site of biopsy in the RIGHT upper outer breast. Final Assessment: Post Procedure Mammograms for Marker Placement Electronically Signed   By: Meda Klinefelter M.D.   On: 05/02/2021 09:29  MS DIGITAL SCREENING TOMO BILATERAL  Result Date: 03/26/2021 CLINICAL DATA:  Screening. EXAM: DIGITAL SCREENING BILATERAL MAMMOGRAM WITH TOMOSYNTHESIS AND CAD TECHNIQUE: Bilateral screening digital craniocaudal and mediolateral oblique mammograms were obtained. Bilateral screening digital breast tomosynthesis was performed. The images were evaluated with computer-aided detection. COMPARISON:  Previous exam(s). ACR Breast Density Category c: The breast tissue is heterogeneously dense, which may obscure small masses. FINDINGS: In the right breast, 2 possible masses warrant further evaluation. In the left breast, no findings suspicious for malignancy. IMPRESSION: Further evaluation is suggested for 2 possible masses in the right breast. RECOMMENDATION: Ultrasound of the right breast. (Code:US-R-67M) The patient will be contacted regarding the findings, and additional imaging will be scheduled. BI-RADS CATEGORY  0: Incomplete. Need additional imaging evaluation and/or prior mammograms for comparison. Electronically Signed   By: Elberta Fortis M.D.   On: 03/26/2021 16:33      Pelvic/Bimanual Ext Genitalia No lesions, no swelling and no discharge observed on external genitalia.        Vagina Vagina pink and normal texture. No lesions or discharge observed in vagina.        Cervix Cervix is present. Cervix pink and of normal texture. No discharge observed.    Uterus Uterus is present and palpable. Uterus in normal position and normal size.        Adnexae Bilateral ovaries present and palpable. No tenderness on palpation.         Rectovaginal No rectal exam completed  today since patient had no rectal complaints. No skin abnormalities observed on exam.     Smoking History: Patient has never smoked and was not  referred to quit line.    Patient Navigation: Patient education provided. Access to services provided for patient through BCCCP program. Delos Haring interpreter provided. No transportation provided   Colorectal Cancer Screening: Per patient has never had colonoscopy completed No complaints today. FIT test per Phineas Real given 01/21/23.   Breast and Cervical Cancer Risk Assessment: Patient does not have family history of breast cancer, known genetic mutations, or radiation treatment to the chest before age 70. Patient does not have history of cervical dysplasia, immunocompromised, or DES exposure in-utero.  Risk Scores as of Encounter on 02/09/2023     Dondra Spry           5-year 1.57%   Lifetime 10.96%   This patient is Hisp[place]/Latina but has no documented birth country, so the [place] model used data from Axis patients to calculate their risk score. Document a birth country in the Demographics activity for a more accurate score.         Last calculated by Narda Rutherford, LPN on 16/04/958 at 12:55 PM        A: BCCCP exam with pap smear No complaints with benign exam. Follow up right breast mass.   P: Referred patient to the [facility] for a diagnostic mammogram. Appointment scheduled 02/09/2023.  Ilda Basset A, NP 02/09/2023 1:09 PM

## 2023-02-13 ENCOUNTER — Telehealth: Payer: Self-pay

## 2023-02-13 ENCOUNTER — Other Ambulatory Visit: Payer: Self-pay | Admitting: Hematology and Oncology

## 2023-02-13 LAB — CYTOLOGY - PAP
Adequacy: ABSENT
Comment: NEGATIVE
Diagnosis: NEGATIVE
High risk HPV: NEGATIVE

## 2023-02-13 MED ORDER — METRONIDAZOLE 500 MG PO TABS: 500.0000 mg | ORAL_TABLET | Freq: Two times a day (BID) | ORAL | 0 refills | Status: DC

## 2023-02-13 NOTE — Telephone Encounter (Signed)
Via, Kristeen Mans, [facility] Spanish Interpreter, left message on voicemail requesting return call. Attempted to call patient regarding lab (pap test) results.

## 2023-02-17 ENCOUNTER — Telehealth: Payer: Self-pay

## 2023-02-17 NOTE — Telephone Encounter (Signed)
Via Kristeen Mans, [facility] Spanish Interpreter, Patient informed negative Pap/HPV results, repeat pap in 5 years, also revealed bacterial vaginosis, needs rx metronidazole. Patient informed to take 1 po bid x 7 days, avoid alcoholo while taking meds,  rx was sent to the [facility]. Patient verbalized understanding.

## 2023-03-04 NOTE — Progress Notes (Unsigned)
NEUROLOGY CONSULTATION NOTE  [name] MRN: [id number] DOB: 1975/07/31  Referring provider: Beverely Low, MD Primary care provider: Marya Fossa, PA-C  Reason for consult:  headache  Assessment/Plan:   ***   Subjective:  Deanna Hall is a 47 year old ***-handed female with HTN, DM 2, astham, and depression who presents for headache.  History supplemented by referring provider's note.  Began having new headaches in 2023, which are different than her typical migraines.  Describes persistent *** nonthrobbing ***.  No associated nausea, vomiting, visual disturbance ***.  Wakes her from sleep at night.  Takes Tylenol ***  MRI of brain without contrast on 10/29/2022 personally reviewed revealed 6 mm meningioma overlying the anterior left frontal lobe slightly indenting the underlying brain parenchyma without edema, as well as few nonspecific small T2 FLAIR hyperintense foci within the cerebral white matter likely reflecting chronic small vessel ischemic changes or sequelae of migraine.    Past NSAIDS/analgesics:  Fioricet (adverse reaction) Past abortive triptans:  *** Past abortive ergotamine:  none Past muscle relaxants:  Flexeril, Robaxin Past anti-emetic:  *** Past antihypertensive medications:  lisinopril Past antidepressant medications:  *** Past anticonvulsant medications:  gabapentin Past anti-CGRP:  none Past antihistamines/decongestants:  Zyrtec Other past therapies:  ***  Current NSAIDS/analgesics:  *** Current triptans:  none Current ergotamine:  none Current anti-emetic:  none Current muscle relaxants:  baclofen 10mg  Current Antihypertensive medications:  none Current Antidepressant medications:  none Current Anticonvulsant medications:  none Current anti-CGRP:  none Current Vitamins/Herbal/Supplements:  none Current Antihistamines/Decongestants:  none Other therapy:  none Birth control:  none Other medications:  ***   Caffeine:   *** Alcohol:  *** Smoker:  *** Diet:  *** Exercise:  *** Depression:  ***; Anxiety:  *** Other pain:  *** Sleep hygiene:  *** Family history:  Father (malignant brain tumors), sister (seizures)      PAST MEDICAL HISTORY: Past Medical History:  Diagnosis Date   Asthma 2010   Complication of anesthesia    woke up during one of her breast surgery   Depression    Diabetes mellitus without complication (HCC)    GERD (gastroesophageal reflux disease)    occ   Headache    migraines   Hypertension    Lump or mass in breast 2010    PAST SURGICAL HISTORY: Past Surgical History:  Procedure Laterality Date   BIOPSY BREAST Right 05/02/2021   u/s bx x 3 heart 1:30 4cmfn, venus 10:00 3cmfn, ribbon 10:00 2cmfn   BREAST BIOPSY Right 2017   benign, s shape marker   BREAST BIOPSY Right 07/07/2017   path pending, coil shape   BREAST CYST EXCISION Bilateral 1999   neg   BREAST EXCISIONAL BIOPSY Right    1:30 Excision   BREAST LUMPECTOMY WITH RADIOFREQUENCY TAG IDENTIFICATION Right 06/11/2021   Procedure: BREAST LUMPECTOMY WITH RADIOFREQUENCY TAG IDENTIFICATION with RNFA or 2nd scrub;  Surgeon: Leafy Ro, MD;  Location: [facility] ORS;  Service: General;  Laterality: Right;  Provider requesting 1.5 hours / 90 minutes for procedure   BREAST SURGERY Bilateral 2000   bilateral-cyst removed     MEDICATIONS: Current Outpatient Medications on File Prior to Visit  Medication Sig Dispense Refill   acetaminophen (TYLENOL) 500 MG tablet Take 500 mg by mouth every 6 (six) hours as needed.     atorvastatin (LIPITOR) 20 MG tablet Take 20 mg by mouth at bedtime.     baclofen (LIORESAL) 10 MG tablet Take 10 mg by mouth  3 (three) times daily.     butalbital-acetaminophen-caffeine (FIORICET) 50-325-40 MG tablet Take 1 tablet by mouth every 6 (six) hours as needed for migraine.     cetirizine (ZYRTEC) 10 MG tablet Take 10 mg by mouth daily as needed.     cyclobenzaprine (FLEXERIL) 5 MG tablet Take 5  mg by mouth at bedtime.     gabapentin (NEURONTIN) 100 MG capsule Take 100 mg by mouth 3 (three) times daily.     lisinopril (ZESTRIL) 2.5 MG tablet Take 2.5 mg by mouth at bedtime.     metFORMIN (GLUCOPHAGE) 1000 MG tablet Take 1,000 mg by mouth 2 (two) times daily.     methocarbamol (ROBAXIN) 500 MG tablet Take 1 tablet (500 mg total) by mouth 4 (four) times daily. (Patient taking differently: Take 500 mg by mouth at bedtime.) 16 tablet 0   metroNIDAZOLE (FLAGYL) 500 MG tablet Take 1 tablet (500 mg total) by mouth 2 (two) times daily. 14 tablet 0   sertraline (ZOLOFT) 100 MG tablet Take 100 mg by mouth at bedtime.     No current facility-administered medications on file prior to visit.    ALLERGIES: Allergies  Allergen Reactions   Naproxen Shortness Of Breath, Swelling, Rash and Anaphylaxis   Penicillins Hives    BREATHING PROBLEMS    Amoxicillin     BREATHING PROBLEMS    Augmentin [Amoxicillin-Pot Clavulanate]     BREATHING PROBLEMS    Nsaids Swelling   Aspirin Rash   Ibuprofen Rash    BREASTING PROBLEMS     FAMILY HISTORY: Family History  Problem Relation Age of Onset   Hyperlipidemia Mother    Hypertension Mother    Diabetes Mother    Hyperlipidemia Father    Hypertension Father    Diabetes Father    Brain cancer Father    Breast cancer Neg Hx     Objective:  *** General: No acute distress.  Patient appears well-groomed.   Head:  Normocephalic/atraumatic Eyes:  fundi examined but not visualized Neck: supple, no paraspinal tenderness, full range of motion Back: No paraspinal tenderness Heart: regular rate and rhythm Lungs: Clear to auscultation bilaterally. Vascular: No carotid bruits. Neurological Exam: Mental status: alert and oriented to person, place, and time, speech fluent and not dysarthric, language intact. Cranial nerves: CN I: not tested CN II: pupils equal, round and reactive to light, visual fields intact CN III, IV, VI:  full range of  motion, no nystagmus, no ptosis CN V: facial sensation intact. CN VII: upper and lower face symmetric CN VIII: hearing intact CN IX, X: gag intact, uvula midline CN XI: sternocleidomastoid and trapezius muscles intact CN XII: tongue midline Bulk & Tone: normal, no fasciculations. Motor:  muscle strength 5/5 throughout Sensation:  Pinprick, temperature and vibratory sensation intact. Deep Tendon Reflexes:  2+ throughout,  toes downgoing.   Finger to nose testing:  Without dysmetria.   Heel to shin:  Without dysmetria.   Gait:  Normal station and stride.  Romberg negative.    Thank you for allowing me to take part in the care of this patient.  Shon Millet, DO  CC: ***

## 2023-03-09 ENCOUNTER — Ambulatory Visit: Payer: Self-pay | Admitting: Neurology

## 2023-03-09 ENCOUNTER — Encounter: Payer: Self-pay | Admitting: Neurology

## 2023-03-09 DIAGNOSIS — Z029 Encounter for administrative examinations, unspecified: Secondary | ICD-10-CM

## 2023-03-12 ENCOUNTER — Other Ambulatory Visit: Payer: Self-pay | Admitting: Hematology and Oncology

## 2023-03-12 ENCOUNTER — Other Ambulatory Visit: Payer: Self-pay

## 2023-03-12 DIAGNOSIS — Z9889 Other specified postprocedural states: Secondary | ICD-10-CM

## 2023-03-12 DIAGNOSIS — N6312 Unspecified lump in the right breast, upper inner quadrant: Secondary | ICD-10-CM

## 2023-03-26 ENCOUNTER — Telehealth: Payer: Self-pay | Admitting: Hematology and Oncology

## 2023-04-02 ENCOUNTER — Ambulatory Visit
Admission: RE | Admit: 2023-04-02 | Discharge: 2023-04-02 | Disposition: A | Discharge status: Home / Self Care | Payer: Self-pay | Source: Ambulatory Visit | Attending: Hematology and Oncology | Admitting: Hematology and Oncology

## 2023-04-02 DIAGNOSIS — Z9889 Other specified postprocedural states: Secondary | ICD-10-CM | POA: Insufficient documentation

## 2023-04-02 MED ORDER — GADOBUTROL 1 MMOL/ML IV SOLN
6.0000 mL | Freq: Once | INTRAVENOUS | Status: AC | PRN
  Administered 2023-04-02: 7.5 mL via INTRAVENOUS

## 2023-05-25 ENCOUNTER — Encounter: Payer: Self-pay | Admitting: Surgery

## 2023-05-25 ENCOUNTER — Ambulatory Visit (INDEPENDENT_AMBULATORY_CARE_PROVIDER_SITE_OTHER): Payer: Self-pay | Admitting: Surgery

## 2023-05-25 VITALS — BP 110/76 | HR 90 | Temp 98.0°F | Ht 64.0 in | Wt 165.0 lb

## 2023-05-25 DIAGNOSIS — Z09 Encounter for follow-up examination after completed treatment for conditions other than malignant neoplasm: Secondary | ICD-10-CM

## 2023-05-25 DIAGNOSIS — D241 Benign neoplasm of right breast: Secondary | ICD-10-CM

## 2023-05-25 NOTE — Patient Instructions (Addendum)
The patient has been asked to return to the office in November with a bilateral diagnostic mammogram done prior. We will send you a letter about these appointments.   Se le ha pedido a la paciente que regrese al [facility] en noviembre para que le hayan realizado previamente una mamografa diagnstica bilateral. Conley Rolls enviaremos una carta sobre estas citas.   Autoexamen de [employer] Breast Self-Awareness El autoexamen de mamas es para Solicitor la apariencia y la sensibilidad de las [place]. Es necesario que respete estas indicaciones: Controle sus Counsellor. Informe al mdico si hay algn cambio. Familiarcese con el aspecto y con la forma en que se sienten sus mamas al palparlas. Esto puede ayudarle a Nurse, children's en las mamas mientras todava es pequeo y puede tratarse. Debe hacerse un autoexamen de mamas incluso si tiene implantes mamarios. Lo que necesita: Un espejo. Una habitacin bien iluminada. Una almohada u otro objeto blando. Cmo realizar el autoexamen de mamas Siga estos pasos para Education officer, environmental un autoexamen de mamas: Busque cambios  Qutese toda la ropa por encima de la cintura. Prese frente a un espejo en una habitacin con buena iluminacin. Coloque las manos a los costados. Compare las mamas en el espejo. Busque diferencias entre ellas, por ejemplo: Una diferencia en la forma. Una diferencia en el tamao. Arrugas, depresiones y protuberancias en Deborha Payment y no en la otra. Observe cada mama para buscar cambios en la piel, por ejemplo: Enrojecimiento. Zonas escamosas. Piel que se ha engrosado. Hoyuelos. Llagas abiertas (lceras). Observe si hay cambios en los pezones, por ejemplo: Lquido que sale de un pezn. Lquido alrededor de Technical brewer. Sangrado. Hoyuelos. Enrojecimiento. Un pezn que parece metido hacia adentro (retrado) o que ha cambiado de posicin. Palpe si hay cambios Acustese boca arriba. Plpese cada mama. Para hacer esto: Elija una mama  para palpar. Coloque una almohada debajo del hombro ms cercano a esa mama. Coloque el brazo ms cercano a esa mama por detrs de la cabeza. Plpese la zona del pezn de esa mama con la mano del otro brazo. Plpese la zona con las yemas de los tres dedos del medio y haga crculos con los dedos. Utilice una presin ligera, media y [employer]. Contine superponiendo crculos, movindose hacia abajo por encima de la mama. Contine haciendo crculos con los dedos. Detngase cuando sienta las costillas. Empiece a hacer crculos con los dedos nuevamente, esta vez haciendo movimientos ascendentes Civil Service fast streamer a Curator. Luego, haga crculos hacia afuera, de un lado a otro de la mama y [employer] zona de la axila. Apriete el pezn. Compruebe si hay secrecin y bultos. Repita estos pasos para examinar la otra mama. Sintese o prese en la ducha o la baera. Con agua jabonosa en la piel, plpese cada mama del mismo modo que lo hizo [place]. Anote sus hallazgos Anotar lo que encuentra puede ayudarla a recordar qu contarle al mdico. [place] los siguientes datos: Qu es normal para cada mama. Cualquier cambio que haya encontrado en cada mama. Estos incluyen: La clase de cambios que encuentra. Dolor o sensibilidad en la mama. Cualquier bulto que encuentre. Anote el tamao y dnde se encuentra. Cundo tuvo su ltimo perodo (ciclo menstrual). Consejos generales Si est amamantando, el mejor momento para el examen de las mamas es despus de darle de Psychologist, clinical al beb o despus de usar un sacaleches. Si tiene [employer], el mejor momento para examinarse las [place] es de 5 a 7 das despus de finalizado su ciclo mensual. Con  el [place], se sentir cmoda con el autoexamen. Tambin comenzar a saber si hay cambios en sus mamas. Comunquese con un mdico si: Observa un cambio en la forma o el tamao de las [address] o los pezones. Observa un cambio en la piel de las mamas o los pezones, como  la piel enrojecida o escamosa. Tiene secrecin de lquido proveniente de los pezones que no es normal. [place] un nuevo bulto o una zona engrosada. Tiene dolor de [place]. Tiene alguna inquietud [employer] de la [place]. Resumen El autoexamen de mamas incluye buscar cambios en las mamas y palpar para Engineer, manufacturing cualquier cambio en las [place]. Debe hacer el autoexamen de mamas frente a un espejo en una habitacin bien iluminada. Si tiene perodos (Occupational hygienist), el mejor momento para examinarse las [place] es de 5 a 7 das despus de finalizado el perodo menstrual. Informe al mdico cualquier cambio que vea en sus mamas. Los cambios incluyen cambios en el tamao, cambios en la piel, Engineer, mining o sensibilidad, o lquido que sale de los pezones que no es normal. Esta informacin no tiene Theme park manager el consejo del mdico. Asegrese de hacerle al mdico cualquier pregunta que tenga. Document Revised: 02/13/2021 Document Reviewed: 02/13/2021 Elsevier Patient Education  2024 [employer].

## 2023-05-29 ENCOUNTER — Encounter: Payer: Self-pay | Admitting: Surgery

## 2023-05-29 NOTE — Progress Notes (Signed)
Outpatient Surgical Follow Up  05/29/2023  Deanna Hall is an 48 y.o. female.   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 She   Did have recent MRI that I have personally reviewed showing evidence  of prior surgical changes without any suspicious findings. She had prior breast cyst excised on both breasts SHe is otherwise doing well  Past Medical History:  Diagnosis Date   Asthma 2010   Complication of anesthesia    woke up during one of her breast surgery   Depression    Diabetes mellitus without complication (HCC)    GERD (gastroesophageal reflux disease)    occ   Headache    migraines   Hypertension    Lump or mass in breast 2010    Past Surgical History:  Procedure Laterality Date   BIOPSY BREAST Right 05/02/2021   u/s bx x 3 heart 1:30 4cmfn, venus 10:00 3cmfn, ribbon 10:00 2cmfn   BREAST BIOPSY Right 2017   benign, s shape marker   BREAST BIOPSY Right 07/07/2017   path pending, coil shape   BREAST CYST EXCISION Bilateral 1999   neg   BREAST EXCISIONAL BIOPSY Right    1:30 Excision   BREAST LUMPECTOMY WITH RADIOFREQUENCY TAG IDENTIFICATION Right 06/11/2021   Procedure: BREAST LUMPECTOMY WITH RADIOFREQUENCY TAG IDENTIFICATION with RNFA or 2nd scrub;  Surgeon: Leafy Ro, MD;  Location: [facility] ORS;  Service: General;  Laterality: Right;  Provider requesting 1.5 hours / 90 minutes for procedure   BREAST SURGERY Bilateral 2000   bilateral-cyst removed     Family History  Problem Relation Age of Onset   Hyperlipidemia Mother    Hypertension Mother    Diabetes Mother    Hyperlipidemia Father    Hypertension Father    Diabetes Father    Brain cancer Father    Breast cancer Neg Hx     Social History:  reports that she has never smoked. She has never been exposed to tobacco smoke. She  has never used smokeless tobacco. She reports that she does not drink alcohol and does not use drugs.  Allergies:  Allergies  Allergen Reactions   Naproxen Shortness Of Breath, Swelling, Rash and Anaphylaxis   Penicillins Hives    BREATHING PROBLEMS    Amoxicillin     BREATHING PROBLEMS    Augmentin [Amoxicillin-Pot Clavulanate]     BREATHING PROBLEMS    Nsaids Swelling   Aspirin Rash   Ibuprofen Rash    BREASTING PROBLEMS     Medications reviewed.    ROS Full ROS performed and is otherwise negative other than what is stated in HPI   BP 110/76   Pulse 90   Temp 98 F (36.7 C)   Ht 5\' 4"  (1.626 m)   Wt 165 lb (74.8 kg)   SpO2 99%   BMI 28.32 kg/m   Physical Exam  Physical Exam EYES: Pupils are equal, round,  Sclera are non-icteric. EARS, NOSE, MOUTH AND THROAT: She is wearing a mask, Hearing is intact to voice. LYMPH NODES:  Lymph nodes in the neck are normal. RESPIRATORY:  Lungs are clear. There is normal respiratory effort, with equal breath sounds bilaterally, and without pathologic use of accessory muscles. CARDIOVASCULAR: Heart is regular without murmurs, gallops, or rubs. BREAST: She has bilateral lateral fibrocystic breast fulness. No discrete mass. Prior  scars on both breast. Scar on the left breast w/o significant deformity GI: The abdomen is NAD soft, nontender, and nondistended. There are no palpable masses. There is no hepatosplenomegaly. There are normal bowel sounds in all quadrants. GU: Rectal deferred.   MUSCULOSKELETAL: Normal muscle strength and tone. No cyanosis or edema.   SKIN: Turgor is good and there are no pathologic skin lesions or ulcers. NEUROLOGIC: Motor and sensation is grossly normal. Cranial nerves are grossly intact. PSYCH:  Oriented to person, place and time. Affect is normal.     Assessment/Plan: 48 year old female with history of fibroadenoma of the right breast now with multiple fibrocystic changes on bilateral breast  without evidence of suspicious lesions and they are stable.   No need for further surgical intervention at this time We can go back to yearly mammos and U/S on the Right breast.  Please note that I spent greater than 30 minutes in this encounter including personally reviewing imaging studies, coordinating her care, placing orders and performing appropriate documentation  Sterling Big, MD [facility] General Surgeon

## 2023-12-28 ENCOUNTER — Other Ambulatory Visit: Payer: Self-pay

## 2023-12-28 DIAGNOSIS — N6310 Unspecified lump in the right breast, unspecified quadrant: Secondary | ICD-10-CM

## 2023-12-28 DIAGNOSIS — Z9889 Other specified postprocedural states: Secondary | ICD-10-CM

## 2024-03-07 NOTE — Progress Notes (Unsigned)
 Deanna Hall is a 48 y.o. female who presents to [facility] clinic today with {Blank single:19197::no complaints,complaint of} ***.    Pap Smear: Pap smear not completed today. Last Pap smear was 02/09/2023 at [facility] clinic and was normal with negative HPV that showed BV. Per patient has no history of an abnormal Pap smear. Last Pap smear result is available in Epic.   Physical exam: Breasts Breasts symmetrical. No skin abnormalities bilateral breasts. No nipple retraction bilateral breasts. No nipple discharge bilateral breasts. No lymphadenopathy. No lumps palpated bilateral breasts.      MS 3D DIAG MAMMO BILAT BR (aka MM) Result Date: 02/09/2023 CLINICAL DATA:  Here for follow-up of two masses in the right breast at 10 o'clock 3 cm from the nipple and 10 o'clock 2 cm from the nipple which were biopsied on 05/02/2021 and demonstrated fibroepithelial lesions. The patient is also due for annual mammogram of the left breast. EXAM: DIGITAL DIAGNOSTIC BILATERAL MAMMOGRAM WITH TOMOSYNTHESIS AND CAD; ULTRASOUND RIGHT BREAST LIMITED TECHNIQUE: Bilateral digital diagnostic mammography and breast tomosynthesis was performed. The images were evaluated with computer-aided detection. ; Targeted ultrasound examination of the right breast was performed COMPARISON:  Previous exam(s). ACR Breast Density Category c: The breasts are heterogeneously dense, which may obscure small masses. FINDINGS: The previously biopsied masses in the upper-outer right breast (Venus and ribbon clips) are not significantly changed since 05/02/2021. No suspicious mass, microcalcification, or other finding is identified in the left breast. Targeted right breast ultrasound was performed. At 10 o'clock 3 cm from the nipple an oval circumscribed hypoechoic mass measures 9 x 7 x 11 mm. This is not significantly changed since 04/12/2021 and represents the previously biopsied fibroepithelial lesion. At 10 o'clock 2 cm from the nipple an  oval circumscribed hypoechoic mass measures 7 x 3 x 8 mm. This is not significantly changed since 04/12/2021 and represents the previously biopsied fibroepithelial lesion. IMPRESSION: Previously biopsied fibroepithelial lesions in the right breast are not significantly changed since 04/12/2021. No evidence of malignancy in the left breast. RECOMMENDATION: Recommended diagnostic right breast mammogram and ultrasound in 12 months to document greater than 2 years of stability. At that time, the patient will be due for annual mammogram of the left breast. The patient is also due for bilateral breast MR in January 2025. I have discussed the findings and recommendations with the patient. If applicable, a reminder letter will be sent to the patient regarding the next appointment. BI-RADS CATEGORY  3: Probably benign. Electronically Signed   By: Norman Hopper M.D.   On: 02/09/2023 15:35   MS DIGITAL DIAG TOMO BILAT Result Date: 01/07/2022 CLINICAL DATA:  48 year old female presenting for delayed six-month follow-up of probably benign masses in the right breast that were biopsied demonstrating fibroepithelial lesion. Patient had a third mass biopsied in the upper inner right breast that was surgically excised. 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 EXAM: DIGITAL DIAGNOSTIC BILATERAL MAMMOGRAM WITH TOMOSYNTHESIS; ULTRASOUND RIGHT BREAST LIMITED TECHNIQUE: Bilateral digital diagnostic mammography and breast tomosynthesis was performed.; Targeted ultrasound examination of the right breast was performed COMPARISON:  Previous exam(s). ACR Breast Density Category c: The breast tissue is heterogeneously dense, which may obscure small masses. FINDINGS: Mammogram: Right breast: There are ribbon and Venus shaped biopsy marking clips in the upper outer right breast at the sites of recent benign biopsy demonstrating fibroepithelial lesion. The biopsied masses appear stable. There  are no new suspicious findings elsewhere in the  right breast. Left breast: No new suspicious mass, distortion, or microcalcifications are identified to suggest presence of malignancy. Ultrasound: Targeted ultrasound is performed in the right breast at 10 o'clock 3 cm from the nipple demonstrating an oval circumscribed hypoechoic mass measuring 1.0 x 0.6 x 1.1 cm, previously measuring 1.0 x 0.6 x 1.0 cm. Targeted ultrasound performed in the right breast at 10 o'clock 2 cm from the nipple demonstrating an oval circumscribed hypoechoic mass measuring 0.9 x 0.4 x 0.7 cm, previously measuring 1.0 x 0.4 x 0.8 cm. IMPRESSION: 1. Stable probably benign masses in the right breast at 10 o'clock, previously biopsied demonstrating fibroepithelial lesion. 2.  No new suspicious findings in the bilateral breasts. RECOMMENDATION: 1.  Right breast ultrasound in January 2023. 2. Patient is due for bilateral breast MRI as a follow-up from recent biopsy. I have discussed the findings and recommendations with the patient. If applicable, a reminder letter will be sent to the patient regarding the next appointment. BI-RADS CATEGORY  3: Probably benign. Electronically Signed   By: Inocente Ast M.D.   On: 01/07/2022 10:42  MM Breast Surgical Specimen Result Date: 06/11/2021 CLINICAL DATA:  Status post RF device localized RIGHT breast lumpectomy. EXAM: SPECIMEN RADIOGRAPH OF THE RIGHT BREAST COMPARISON:  Previous exam(s). FINDINGS: Status post excision of the RIGHT breast. The RF device and Heart shaped clip are present within the specimen. I discussed the findings with Dr. Jordis at the time of interpretation. IMPRESSION: Specimen radiograph of the RIGHT breast. Electronically Signed   By: Almarie Daring M.D.   On: 06/11/2021 08:54  MM DIAG BREAST TOMO UNI RIGHT Result Date: 06/05/2021 CLINICAL DATA:  Status post ultrasound-guided RF device placement in right breast 1:30 o'clock mass. EXAM: DIAGNOSTIC RIGHT MAMMOGRAM POST  ULTRASOUND-GUIDED RF device PLACEMENT COMPARISON:  Prior films FINDINGS: Mammographic images were obtained following ultrasound-guided RF device placement. These demonstrate RF device in mass of concern right breast 1:30 o'clock. IMPRESSION: Appropriate location of the RF device. Final Assessment: Post Procedure Mammograms for RF device placement Electronically Signed   By: Craig Farr M.D.   On: 06/05/2021 16:27  MM CLIP PLACEMENT LEFT Result Date: 05/27/2021 CLINICAL DATA:  Status post MRI guided core needle biopsy retroareolar left breast mass. EXAM: 3D DIAGNOSTIC LEFT MAMMOGRAM POST MRI BIOPSY COMPARISON:  Previous exam(s). FINDINGS: 3D Mammographic images were obtained following MRI guided biopsy of retroareolar left breast mass. The biopsy marking clip is in expected position at the site of biopsy. IMPRESSION: Appropriate positioning of the dumbbell shaped biopsy marking clip at the site of biopsy in the retroareolar left breast. Final Assessment: Post Procedure Mammograms for Marker Placement Electronically Signed   By: Bard Moats M.D.   On: 05/27/2021 09:12  MS CLIP PLACEMENT RIGHT Result Date: 05/02/2021 CLINICAL DATA:  Status post 3 site ultrasound-guided biopsy EXAM: DIAGNOSTIC RIGHT MAMMOGRAM POST ULTRASOUND BIOPSY COMPARISON:  Previous exam(s). FINDINGS: Site 1: 1:30 4 cm from the nipple Mammographic images were obtained following ultrasound guided biopsy of a mass at 1:30. The heart biopsy marking clip is in expected position at the site of biopsy. Site 2:10 o'clock 3 cm from the nipple Mammographic images were obtained following ultrasound guided biopsy of a mass at 10 o'clock. The VENUS biopsy marking clip is in expected position at the site of biopsy. Site 3:10 o'clock 2 cm from the nipple Mammographic images were obtained following ultrasound guided biopsy of a mass at 10 o'clock. The RIBBON biopsy marking clip is in expected position at the site  of biopsy. IMPRESSION: 1. Appropriate  positioning of the HEART shaped biopsy marking clip at the site of biopsy in the RIGHT upper inner breast. 2. Appropriate positioning of the VENUS shaped biopsy marking clip at the site of biopsy in the RIGHT upper outer breast. 3. Appropriate positioning of the RIBBON shaped biopsy marking clip at the site of biopsy in the RIGHT upper outer breast. Final Assessment: Post Procedure Mammograms for Marker Placement Electronically Signed   By: Corean Salter M.D.   On: 05/02/2021 09:29  MS DIGITAL SCREENING TOMO BILATERAL Result Date: 03/26/2021 CLINICAL DATA:  Screening. EXAM: DIGITAL SCREENING BILATERAL MAMMOGRAM WITH TOMOSYNTHESIS AND CAD TECHNIQUE: Bilateral screening digital craniocaudal and mediolateral oblique mammograms were obtained. Bilateral screening digital breast tomosynthesis was performed. The images were evaluated with computer-aided detection. COMPARISON:  Previous exam(s). ACR Breast Density Category c: The breast tissue is heterogeneously dense, which may obscure small masses. FINDINGS: In the right breast, 2 possible masses warrant further evaluation. In the left breast, no findings suspicious for malignancy. IMPRESSION: Further evaluation is suggested for 2 possible masses in the right breast. RECOMMENDATION: Ultrasound of the right breast. (Code:US -R-[id number]) The patient will be contacted regarding the findings, and additional imaging will be scheduled. BI-RADS CATEGORY  0: Incomplete. Need additional imaging evaluation and/or prior mammograms for comparison. Electronically Signed   By: Toribio Agreste M.D.   On: 03/26/2021 16:33    Pelvic/Bimanual Pap is not indicated today per BCCCP guidelines.   Smoking History: Patient has {Blank single:19197::never smoked,is a former smoker,is a current smoker at *** packs per day} ***referred to quit line.    Patient Navigation: Patient education provided. Access to services provided for patient through [facility]CAST program. Spanish interpreter  Damon Pierce from [facility] provided.   Colorectal Cancer Screening: Per patient {Blank single:19197::has had colonoscopy completed on ***,has never had colonoscopy completed} No complaints today.    Breast and Cervical Cancer Risk Assessment: Patient {Blank single:19197::has,does not have} family history of breast cancer, known genetic mutations, or radiation treatment to the chest before age 3. Patient {Blank single:19197::has,does not have} history of cervical dysplasia, immunocompromised, or DES exposure in-utero.  Risk Scores as of Encounter on 03/08/2024     [place] as of 02/09/2023           5-year 1.57%   Lifetime 10.96%   This patient is [place]/Latina but has no documented birth country, so the [place] model used data from [place] patients to calculate their risk score. Document a birth country in the Demographics activity for a more accurate score.         Last calculated by Rogerio Tempie SQUIBB, LPN on 88/08/7973 at 12:55 PM        A: BCCCP exam without pap smear Complaint of ***  P: Referred patient to the [facility] for a diagnostic mammogram. Appointment scheduled Tuesday, March 08, 2024 at 1340.  Driscilla Wanda SQUIBB, RN 03/07/2024 10:45 AM

## 2024-03-08 ENCOUNTER — Ambulatory Visit: Payer: Self-pay | Attending: Obstetrics and Gynecology

## 2024-03-08 ENCOUNTER — Other Ambulatory Visit: Payer: Self-pay

## 2024-03-08 ENCOUNTER — Ambulatory Visit
Admission: RE | Admit: 2024-03-08 | Discharge: 2024-03-08 | Disposition: A | Payer: Self-pay | Source: Ambulatory Visit | Attending: Obstetrics and Gynecology | Admitting: Obstetrics and Gynecology

## 2024-03-08 ENCOUNTER — Ambulatory Visit
Admission: RE | Admit: 2024-03-08 | Discharge: 2024-03-08 | Disposition: A | Discharge status: Home / Self Care | Payer: Self-pay | Source: Ambulatory Visit | Attending: Obstetrics and Gynecology | Admitting: Obstetrics and Gynecology

## 2024-03-08 VITALS — BP 119/92 | Wt 172.0 lb

## 2024-03-08 DIAGNOSIS — N644 Mastodynia: Secondary | ICD-10-CM

## 2024-03-08 DIAGNOSIS — N6310 Unspecified lump in the right breast, unspecified quadrant: Secondary | ICD-10-CM

## 2024-03-08 DIAGNOSIS — Z1211 Encounter for screening for malignant neoplasm of colon: Secondary | ICD-10-CM

## 2024-03-08 DIAGNOSIS — Z9889 Other specified postprocedural states: Secondary | ICD-10-CM | POA: Insufficient documentation

## 2024-03-08 DIAGNOSIS — Z1239 Encounter for other screening for malignant neoplasm of breast: Secondary | ICD-10-CM

## 2024-03-08 DIAGNOSIS — N6323 Unspecified lump in the left breast, lower outer quadrant: Secondary | ICD-10-CM

## 2024-03-08 NOTE — Patient Instructions (Signed)
 Explained breast self awareness with Deanna Hall. Patient did not need a Pap smear today due to last Pap smear and HPV typing was 02/09/2023. Let her know BCCCP will cover Pap smears and HPV typing every 5 years unless has a history of abnormal Pap smears. Referred patient to the [facility] for a diagnostic mammogram. Appointment scheduled Tuesday, March 08, 2024 at 1340. Patient aware of appointment and will be there. [name] verbalized understanding.  Benna Arno, Wanda Ship, RN 12:58 PM

## 2024-03-09 ENCOUNTER — Other Ambulatory Visit: Payer: Self-pay | Admitting: Obstetrics and Gynecology

## 2024-03-09 DIAGNOSIS — R928 Other abnormal and inconclusive findings on diagnostic imaging of breast: Secondary | ICD-10-CM

## 2024-03-17 ENCOUNTER — Other Ambulatory Visit: Payer: Self-pay | Admitting: Obstetrics and Gynecology

## 2024-03-17 ENCOUNTER — Ambulatory Visit
Admission: RE | Admit: 2024-03-17 | Discharge: 2024-03-17 | Payer: Self-pay | Attending: Obstetrics and Gynecology | Admitting: Obstetrics and Gynecology

## 2024-03-17 DIAGNOSIS — R928 Other abnormal and inconclusive findings on diagnostic imaging of breast: Secondary | ICD-10-CM

## 2024-03-17 MED ORDER — LIDOCAINE 1 % OPTIME INJ - NO CHARGE
2.0000 mL | Freq: Once | INTRAMUSCULAR | Status: AC
  Administered 2024-03-17: 2 mL via INTRADERMAL
  Filled 2024-03-17: qty 2

## 2024-03-17 MED ORDER — LIDOCAINE-EPINEPHRINE 1 %-1:100000 IJ SOLN
10.0000 mL | Freq: Once | INTRAMUSCULAR | Status: AC
  Administered 2024-03-17: 10 mL via INTRADERMAL
  Filled 2024-03-17: qty 10

## 2024-03-19 ENCOUNTER — Ambulatory Visit: Payer: Self-pay | Admitting: Obstetrics & Gynecology

## 2024-04-13 ENCOUNTER — Telehealth: Payer: Self-pay
